# Patient Record
Sex: Male | Born: 1955 | Race: White | Hispanic: No | Marital: Married | Smoking: Current every day smoker
Health system: Southern US, Community
[De-identification: ages and names within clinical notes are randomized; demographics above are authoritative.]

## PROBLEM LIST (undated history)

## (undated) DIAGNOSIS — S2239XA Fracture of one rib, unspecified side, initial encounter for closed fracture: Secondary | ICD-10-CM

## (undated) DIAGNOSIS — I4891 Unspecified atrial fibrillation: Secondary | ICD-10-CM

## (undated) DIAGNOSIS — F1010 Alcohol abuse, uncomplicated: Secondary | ICD-10-CM

## (undated) DIAGNOSIS — G8929 Other chronic pain: Secondary | ICD-10-CM

## (undated) DIAGNOSIS — I10 Essential (primary) hypertension: Secondary | ICD-10-CM

## (undated) DIAGNOSIS — M549 Dorsalgia, unspecified: Secondary | ICD-10-CM

## (undated) DIAGNOSIS — J449 Chronic obstructive pulmonary disease, unspecified: Secondary | ICD-10-CM

## (undated) DIAGNOSIS — E7800 Pure hypercholesterolemia, unspecified: Secondary | ICD-10-CM

## (undated) DIAGNOSIS — M5430 Sciatica, unspecified side: Secondary | ICD-10-CM

## (undated) HISTORY — PX: HERNIA REPAIR: SHX51

---

## 2010-03-23 ENCOUNTER — Emergency Department (HOSPITAL_COMMUNITY): Admission: EM | Admit: 2010-03-23 | Discharge: 2010-03-23 | Payer: Self-pay | Admitting: Emergency Medicine

## 2010-07-26 ENCOUNTER — Emergency Department (HOSPITAL_COMMUNITY): Admission: EM | Admit: 2010-07-26 | Discharge: 2010-07-26 | Payer: Self-pay | Admitting: Emergency Medicine

## 2010-09-15 ENCOUNTER — Emergency Department (HOSPITAL_COMMUNITY)
Admission: EM | Admit: 2010-09-15 | Discharge: 2010-09-15 | Payer: Self-pay | Source: Home / Self Care | Admitting: Emergency Medicine

## 2010-12-11 LAB — COMPREHENSIVE METABOLIC PANEL
ALT: 57 U/L — ABNORMAL HIGH (ref 0–53)
Alkaline Phosphatase: 87 U/L (ref 39–117)
BUN: 16 mg/dL (ref 6–23)
CO2: 25 mEq/L (ref 19–32)
GFR calc non Af Amer: >60 mL/min (ref >60)
Glucose, Bld: 157 mg/dL — ABNORMAL HIGH (ref 70–99)
Potassium: 3 mEq/L — ABNORMAL LOW (ref 3.5–5.1)
Sodium: 139 mEq/L (ref 135–145)
Total Protein: 7.3 g/dL (ref 6.0–8.3)

## 2010-12-11 LAB — RAPID URINE DRUG SCREEN, HOSP PERFORMED
Barbiturates: NOT DETECTED
Benzodiazepines: POSITIVE — AB

## 2010-12-11 LAB — CBC
HCT: 45.2 % (ref 39.0–52.0)
Hemoglobin: 16.3 g/dL (ref 13.0–17.0)
MCHC: 36.1 g/dL — ABNORMAL HIGH (ref 30.0–36.0)
MCV: 92.2 fL (ref 78.0–100.0)
RDW: 12.8 % (ref 11.5–15.5)
WBC: 7.7 10*3/uL (ref 4.0–10.5)

## 2010-12-11 LAB — URINALYSIS, ROUTINE W REFLEX MICROSCOPIC
Leukocytes, UA: NEGATIVE
Nitrite: NEGATIVE
Protein, ur: NEGATIVE mg/dL
Specific Gravity, Urine: 1.015 (ref 1.005–1.030)
Urobilinogen, UA: 0.2 mg/dL (ref 0.0–1.0)

## 2010-12-11 LAB — DIFFERENTIAL
Basophils Absolute: 0 10*3/uL (ref 0.0–0.1)
Basophils Relative: 1 % (ref 0–1)
Eosinophils Absolute: 0.3 10*3/uL (ref 0.0–0.7)
Neutro Abs: 4 10*3/uL (ref 1.7–7.7)
Neutrophils Relative %: 52 % (ref 43–77)

## 2010-12-11 LAB — ETHANOL
Alcohol, Ethyl (B): 157 mg/dL — ABNORMAL HIGH (ref 0–10)
Alcohol, Ethyl (B): 259 mg/dL — ABNORMAL HIGH (ref 0–10)

## 2010-12-11 LAB — URINE MICROSCOPIC-ADD ON

## 2010-12-17 LAB — BASIC METABOLIC PANEL
CO2: 23 mEq/L (ref 19–32)
Chloride: 103 mEq/L (ref 96–112)
Creatinine, Ser: 1.69 mg/dL — ABNORMAL HIGH (ref 0.4–1.5)
GFR calc Af Amer: 52 mL/min — ABNORMAL LOW (ref >60)
Sodium: 135 mEq/L (ref 135–145)

## 2010-12-17 LAB — URINALYSIS, ROUTINE W REFLEX MICROSCOPIC
Glucose, UA: NEGATIVE mg/dL
Leukocytes, UA: NEGATIVE
Protein, ur: NEGATIVE mg/dL
Specific Gravity, Urine: 1.02 (ref 1.005–1.030)
Urobilinogen, UA: 0.2 mg/dL (ref 0.0–1.0)

## 2010-12-17 LAB — URINE MICROSCOPIC-ADD ON

## 2013-03-22 ENCOUNTER — Emergency Department (HOSPITAL_COMMUNITY): Payer: BC Managed Care – PPO

## 2013-03-22 ENCOUNTER — Encounter (HOSPITAL_COMMUNITY): Payer: Self-pay | Admitting: *Deleted

## 2013-03-22 ENCOUNTER — Emergency Department (HOSPITAL_COMMUNITY)
Admission: EM | Admit: 2013-03-22 | Discharge: 2013-03-22 | Disposition: A | Discharge status: Home / Self Care | Payer: BC Managed Care – PPO | Attending: Emergency Medicine | Admitting: Emergency Medicine

## 2013-03-22 DIAGNOSIS — M549 Dorsalgia, unspecified: Secondary | ICD-10-CM | POA: Insufficient documentation

## 2013-03-22 DIAGNOSIS — J40 Bronchitis, not specified as acute or chronic: Secondary | ICD-10-CM | POA: Insufficient documentation

## 2013-03-22 DIAGNOSIS — R059 Cough, unspecified: Secondary | ICD-10-CM | POA: Insufficient documentation

## 2013-03-22 DIAGNOSIS — I10 Essential (primary) hypertension: Secondary | ICD-10-CM | POA: Insufficient documentation

## 2013-03-22 DIAGNOSIS — S20219A Contusion of unspecified front wall of thorax, initial encounter: Secondary | ICD-10-CM | POA: Insufficient documentation

## 2013-03-22 DIAGNOSIS — Y92009 Unspecified place in unspecified non-institutional (private) residence as the place of occurrence of the external cause: Secondary | ICD-10-CM | POA: Insufficient documentation

## 2013-03-22 DIAGNOSIS — Z862 Personal history of diseases of the blood and blood-forming organs and certain disorders involving the immune mechanism: Secondary | ICD-10-CM | POA: Insufficient documentation

## 2013-03-22 DIAGNOSIS — W010XXA Fall on same level from slipping, tripping and stumbling without subsequent striking against object, initial encounter: Secondary | ICD-10-CM | POA: Insufficient documentation

## 2013-03-22 DIAGNOSIS — Z8639 Personal history of other endocrine, nutritional and metabolic disease: Secondary | ICD-10-CM | POA: Insufficient documentation

## 2013-03-22 DIAGNOSIS — R05 Cough: Secondary | ICD-10-CM | POA: Insufficient documentation

## 2013-03-22 DIAGNOSIS — S20212A Contusion of left front wall of thorax, initial encounter: Secondary | ICD-10-CM

## 2013-03-22 DIAGNOSIS — W1809XA Striking against other object with subsequent fall, initial encounter: Secondary | ICD-10-CM | POA: Insufficient documentation

## 2013-03-22 DIAGNOSIS — Y9389 Activity, other specified: Secondary | ICD-10-CM | POA: Insufficient documentation

## 2013-03-22 DIAGNOSIS — G8929 Other chronic pain: Secondary | ICD-10-CM | POA: Insufficient documentation

## 2013-03-22 MED ORDER — CYCLOBENZAPRINE HCL 10 MG PO TABS
10.0000 mg | ORAL_TABLET | Freq: Once | ORAL | Status: AC
  Administered 2013-03-22: 10 mg via ORAL
  Filled 2013-03-22: qty 1

## 2013-03-22 MED ORDER — AMOXICILLIN 500 MG PO CAPS: 500.0000 mg | ORAL_CAPSULE | Freq: Three times a day (TID) | ORAL | Status: DC

## 2013-03-22 MED ORDER — METHOCARBAMOL 500 MG PO TABS: ORAL_TABLET | ORAL | Status: DC

## 2013-03-22 MED ORDER — OXYCODONE-ACETAMINOPHEN 5-325 MG PO TABS: ORAL_TABLET | ORAL | Status: DC

## 2013-03-22 MED ORDER — OXYCODONE-ACETAMINOPHEN 5-325 MG PO TABS
1.0000 | ORAL_TABLET | Freq: Four times a day (QID) | ORAL | Status: DC | PRN
  Administered 2013-03-22: 1 via ORAL
  Filled 2013-03-22: qty 1

## 2013-03-22 NOTE — ED Notes (Signed)
Pt fell last night because he tripped over his dog, hitting his mid center to left side chest area against a concrete hearth. Pt c/o chest pain that is worse with palpation, respirations,

## 2013-03-22 NOTE — ED Provider Notes (Signed)
History    This chart was scribed for Ward Givens, MD by Leone Payor, ED Scribe. This patient was seen in room APA11/APA11 and the patient's care was started 1:49 PM.   CSN: [id number]  Arrival date & time 03/22/13  1304   First MD Initiated Contact with Patient 03/22/13 1338      Chief Complaint  Patient presents with  . Fall  . Chest Pain     The history is provided by the patient. No language interpreter was used.    HPI Comments: William Franklin is a 57 y.o. male who presents to the Emergency Department complaining of a fall that occurred last night. States he tripped over his dog while standing up to get off his couch, causing him to fall against a concrete hearth onto his left chest. He denies head injury or LOC. He is now complaining of pain to L chest and it hurts to cough. States he had a cough prior to the fall but now it painful. He denies any fevers, shortness of breath or fever.  He denies nausea, vomiting or abdominal pain.  He takes BP and cholesterol medication and percocet for back pain. His medications are prescribed by [facility]. He states he took his last percocet pill yesterday after he fell.   PCP Dr Burnadette Pop, [place], [facility]  Pt is a current everyday smoker but denies alcohol use.  History reviewed. No pertinent past medical history. HTN High cholesterol Chronic back pain  History reviewed. No pertinent past surgical history.  No family history on file.  History  Substance Use Topics  . Smoking status: Current Every Day Smoker  . Smokeless tobacco: Not on file  . Alcohol Use: No   Employed in [employer]   Review of Systems  Constitutional: Negative for fever.  Respiratory: Positive for cough.   Cardiovascular: Positive for chest pain.  All other systems reviewed and are negative.    Allergies  Review of patient's allergies indicates no known allergies.  Home Medications   Current Outpatient Rx  Name  Route  Sig  Dispense  Refill   . oxyCODONE-acetaminophen (PERCOCET) 10-325 MG per tablet   Oral   Take 1 tablet by mouth 3 (three) times daily as needed for pain.           BP 154/89  Pulse 84  Temp(Src) 98 F (36.7 C) (Oral)  Resp 19  Ht 6\' 2"  (1.88 m)  Wt 240 lb (108.863 kg)  BMI 30.8 kg/m2  SpO2 99%  Vital signs normal    Physical Exam  Nursing note and vitals reviewed. Constitutional: He is oriented to person, place, and time. He appears well-developed and well-nourished.  Non-toxic appearance. He does not appear ill. No distress.  HENT:  Head: Normocephalic and atraumatic.  Right Ear: External ear normal.  Left Ear: External ear normal.  Nose: Nose normal. No mucosal edema or rhinorrhea.  Mouth/Throat: Oropharynx is clear and moist and mucous membranes are normal. No dental abscesses or edematous.  Eyes: Conjunctivae and EOM are normal. Pupils are equal, round, and reactive to light.  Neck: Normal range of motion and full passive range of motion without pain. Neck supple.  Cardiovascular: Normal rate, regular rhythm and normal heart sounds.  Exam reveals no gallop and no friction rub.   No murmur heard. Pulmonary/Chest: Effort normal and breath sounds normal. No respiratory distress. He has no wheezes. He has no rhonchi. He exhibits no tenderness and no crepitus.  Tender in the anterior axillary line near to the axilla. No crepitance, no bruising or abrasion.  Initially had rales at the bases but cleared when he coughed.   Abdominal: Soft. Normal appearance and bowel sounds are normal. He exhibits no distension. There is no tenderness. There is no rebound and no guarding.  Musculoskeletal: Normal range of motion. He exhibits no edema and no tenderness.  Moves all extremities well.   Neurological: He is alert and oriented to person, place, and time. He has normal strength. No cranial nerve deficit.  Skin: Skin is warm, dry and intact. No rash noted. No erythema. No pallor.  Psychiatric: He has  a normal mood and affect. His speech is normal and behavior is normal. His mood appears not anxious.    ED Course  Procedures (including critical care time)  Medications  oxyCODONE-acetaminophen (PERCOCET/ROXICET) 5-325 MG per tablet 1 tablet (1 tablet Oral Given 03/22/13 1403)  cyclobenzaprine (FLEXERIL) tablet 10 mg (10 mg Oral Given 03/22/13 1404)     DIAGNOSTIC STUDIES: Oxygen Saturation is 99% on RA, normal by my interpretation.    COORDINATION OF CARE: 1:54 PM Discussed treatment plan with pt at bedside and pt agreed to plan.   Pt states he normally gets his percocet from the the [facility], he took his last pill yesterday. Pt needs work note for 2 days.   Dg Chest 2 View  03/22/2013   *RADIOLOGY REPORT*  Clinical Data: Fall and chest pain  CHEST - 2 VIEW  Comparison: None  Findings:  Normal heart size.  There is no pleural effusion or edema identified.  No airspace consolidation.  Coarsened interstitial markings are noted bilaterally. Multiple age indeterminant right rib fractures are noted.  This involves the posteriora right eighth and ninth ribs and anterior second rib.  No displaced left rib fractures identified.  IMPRESSION:  1.  Chronic interstitial coarsening suggestive of COPD. 2.  Multiple age indeterminate right rib fractures.   Original Report Authenticated By: Signa Kell, M.D.    Dg Ribs Unilateral Left  03/22/2013   *RADIOLOGY REPORT*  Clinical Data: Left rib pain  LEFT RIBS - 2 VIEW  Comparison: Plain film 03/20/2013  Findings: Dedicated views of the left ribs demonstrate w healed rib fractures of the left lateral seventh and eighth ribs.  There is atelectasis at the left lung base.  No pneumothorax.  IMPRESSION: Remote fractures of the left ribs.  Left basilar atelectasis.  No pneumothorax.   Original Report Authenticated By: Genevive Bi, M.D.     1. Contusion, chest wall, left, initial encounter   2. Fall at home, initial encounter   3. Bronchitis       New Prescriptions   AMOXICILLIN (AMOXIL) 500 MG CAPSULE    Take 1 capsule (500 mg total) by mouth 3 (three) times daily.   METHOCARBAMOL (ROBAXIN) 500 MG TABLET    Take 2 po QID for chest wall soreness   OXYCODONE-ACETAMINOPHEN (PERCOCET/ROXICET) 5-325 MG PER TABLET    Take 1 or 2 po Q 6hrs for pain     Plan discharge   Devoria Albe, MD, FACEP      MDM   I personally performed the services described in this documentation, which was scribed in my presence. The recorded information has been reviewed and considered.  Devoria Albe, MD, FACEP   Ward Givens, MD 03/22/13 1500

## 2013-03-24 ENCOUNTER — Encounter (HOSPITAL_COMMUNITY): Payer: Self-pay | Admitting: *Deleted

## 2013-03-24 ENCOUNTER — Emergency Department (HOSPITAL_COMMUNITY)
Admission: EM | Admit: 2013-03-24 | Discharge: 2013-03-24 | Disposition: A | Discharge status: Home / Self Care | Payer: BC Managed Care – PPO | Attending: Emergency Medicine | Admitting: Emergency Medicine

## 2013-03-24 DIAGNOSIS — S2239XA Fracture of one rib, unspecified side, initial encounter for closed fracture: Secondary | ICD-10-CM

## 2013-03-24 DIAGNOSIS — F17200 Nicotine dependence, unspecified, uncomplicated: Secondary | ICD-10-CM | POA: Insufficient documentation

## 2013-03-24 DIAGNOSIS — Y929 Unspecified place or not applicable: Secondary | ICD-10-CM | POA: Insufficient documentation

## 2013-03-24 DIAGNOSIS — Z8639 Personal history of other endocrine, nutritional and metabolic disease: Secondary | ICD-10-CM | POA: Insufficient documentation

## 2013-03-24 DIAGNOSIS — S20212D Contusion of left front wall of thorax, subsequent encounter: Secondary | ICD-10-CM

## 2013-03-24 DIAGNOSIS — J4489 Other specified chronic obstructive pulmonary disease: Secondary | ICD-10-CM | POA: Insufficient documentation

## 2013-03-24 DIAGNOSIS — Z79899 Other long term (current) drug therapy: Secondary | ICD-10-CM | POA: Insufficient documentation

## 2013-03-24 DIAGNOSIS — W1809XA Striking against other object with subsequent fall, initial encounter: Secondary | ICD-10-CM | POA: Insufficient documentation

## 2013-03-24 DIAGNOSIS — Z862 Personal history of diseases of the blood and blood-forming organs and certain disorders involving the immune mechanism: Secondary | ICD-10-CM | POA: Insufficient documentation

## 2013-03-24 DIAGNOSIS — Y9389 Activity, other specified: Secondary | ICD-10-CM | POA: Insufficient documentation

## 2013-03-24 DIAGNOSIS — I10 Essential (primary) hypertension: Secondary | ICD-10-CM | POA: Insufficient documentation

## 2013-03-24 DIAGNOSIS — S20219A Contusion of unspecified front wall of thorax, initial encounter: Secondary | ICD-10-CM | POA: Insufficient documentation

## 2013-03-24 DIAGNOSIS — J449 Chronic obstructive pulmonary disease, unspecified: Secondary | ICD-10-CM | POA: Insufficient documentation

## 2013-03-24 HISTORY — DX: Chronic obstructive pulmonary disease, unspecified: J44.9

## 2013-03-24 HISTORY — DX: Pure hypercholesterolemia, unspecified: E78.00

## 2013-03-24 HISTORY — DX: Fracture of one rib, unspecified side, initial encounter for closed fracture: S22.39XA

## 2013-03-24 HISTORY — DX: Essential (primary) hypertension: I10

## 2013-03-24 MED ORDER — OXYCODONE-ACETAMINOPHEN 5-325 MG PO TABS: 1.0000 | ORAL_TABLET | Freq: Four times a day (QID) | ORAL | Status: DC | PRN

## 2013-03-24 NOTE — ED Provider Notes (Signed)
History  This chart was scribed for [facility]. Danae Orleans, working with Ward Givens, MD by Ardelia Mems, ED Scribe. This patient was seen in room APFT24/APFT24 and the patient's care was started at 3:25 PM.   CSN: [id number]  Arrival date & time 03/24/13  1338    Chief Complaint  Patient presents with  . Pain    The history is provided by the patient. No language interpreter was used.   HPI Comments: William Franklin is a 57 y.o. male who presents to the Emergency Department complaining of persistent chest wall pain onset after an accidental fall that occurred 2 days ago. Pt states that he was avoiding his dog and fell onto a hard surface, hitting his chest. Pt states that he suffered a contusion to his chest wall and possibly cracked ribs after the fall 2 days ago. Pt was seen in ED 03/22/13 for this fall. Patient states that he has taken all of pain medication he was given and he is due to go back to work tomorrow but the area continues to hurt. Pt states that he has COPD and chronic cough which has been making the area even more sore. Pt denies fever, sore throat, rash, back pain, dysuria or any other symptoms. Pt is a current every day smoker.   PCP- None  Past Medical History  Diagnosis Date  . COPD (chronic obstructive pulmonary disease)   . Hypertension   . Hypercholesteremia    Past Surgical History  Procedure Laterality Date  . Hernia repair     No family history on file. History  Substance Use Topics  . Smoking status: Current Every Day Smoker  . Smokeless tobacco: Not on file  . Alcohol Use: No    Review of Systems  Constitutional: Negative for fever and chills.  HENT: Negative for congestion, sore throat and rhinorrhea.   Respiratory: Positive for cough. Negative for shortness of breath.   Cardiovascular: Negative for chest pain and leg swelling.  Gastrointestinal: Negative for nausea, vomiting, abdominal pain and diarrhea.  Genitourinary: Negative for dysuria  and hematuria.  Musculoskeletal: Negative for back pain.       Chest wall contusion and possible cracked ribs.  Skin: Negative for rash.  Neurological: Negative for headaches.  Psychiatric/Behavioral: Negative for confusion.   A complete 10 system review of systems was obtained and all systems are negative except as noted in the HPI and PMH.   Allergies  Review of patient's allergies indicates no known allergies.  Home Medications   Current Outpatient Rx  Name  Route  Sig  Dispense  Refill  . amoxicillin (AMOXIL) 500 MG capsule   Oral   Take 1 capsule (500 mg total) by mouth 3 (three) times daily.   21 capsule   0   . methocarbamol (ROBAXIN) 500 MG tablet      Take 2 po QID for chest wall soreness   60 tablet   0   . oxyCODONE-acetaminophen (PERCOCET) 10-325 MG per tablet   Oral   Take 1 tablet by mouth 3 (three) times daily as needed for pain.         Marland Kitchen oxyCODONE-acetaminophen (PERCOCET/ROXICET) 5-325 MG per tablet      Take 1 or 2 po Q 6hrs for pain   15 tablet   0    Triage Vitals: BP 155/91  Pulse 95  Temp(Src) 98.3 F (36.8 C) (Oral)  Resp 22  Ht 6\' 2"  (1.88 m)  Wt 240 lb (413.244  kg)  BMI 30.8 kg/m2  SpO2 97%  Physical Exam  Nursing note and vitals reviewed. Constitutional: He is oriented to person, place, and time. He appears well-developed and well-nourished.  HENT:  Head: Normocephalic and atraumatic.  Speaks in complete sentences.  Eyes: Conjunctivae and EOM are normal. Pupils are equal, round, and reactive to light.  Neck: Normal range of motion. No tracheal deviation present.  Cardiovascular: Normal rate, regular rhythm and normal heart sounds.   Pulmonary/Chest: Effort normal and breath sounds normal. No respiratory distress.  On lung examinations, course breath sounds but no congestion. Left chest wall soreness, no palpable deformity.  Abdominal: Soft. Bowel sounds are normal. There is no tenderness.  Musculoskeletal:  Capillary refill  less than 3 seconds in all extremities.  Neurological: He is alert and oriented to person, place, and time.  Skin: Skin is warm and dry. No rash noted.  Psychiatric: He has a normal mood and affect.    ED Course  Procedures (including critical care time)  DIAGNOSTIC STUDIES: Oxygen Saturation is 97% on RA, normal by my interpretation.    COORDINATION OF CARE: 3:37 PM- Pt advised of plan to receive Percocet upon discharge and pt agrees..      Labs Reviewed - No data to display  No results found.  No diagnosis found.  MDM  I have reviewed nursing notes, vital signs, and all appropriate lab and imaging results for this patient. Patient sustained a fall 2 days ago and injured the chest wall particularly on the left. The patient states that he is out of pain medication and [facility] Patient also states that it takes at least 10 days for him to get a prescription by mail.  Prescription for Percocet one every 6 hours #15 given to the patient. Patient advised that with her pain management [facility]. Patient educated on recent to return items.           Kathie Dike, PA-C 03/24/13 1642

## 2013-03-24 NOTE — ED Notes (Signed)
Seen here Sunday after a fall. Dx with contusion of the chest. Patient  States he has taken all of pain med and is due to go back to work tomorrow but area continues to hurt. States he has a chronic cough which has been making the area even more sore.

## 2013-03-27 NOTE — ED Provider Notes (Signed)
Medical screening examination/treatment/procedure(s) were performed by non-physician practitioner and as supervising physician I was immediately available for consultation/collaboration.  Shelda Jakes, MD 03/27/13 [phone]

## 2013-04-04 ENCOUNTER — Encounter (HOSPITAL_COMMUNITY): Payer: Self-pay | Admitting: *Deleted

## 2013-04-04 ENCOUNTER — Emergency Department (HOSPITAL_COMMUNITY)
Admission: EM | Admit: 2013-04-04 | Discharge: 2013-04-04 | Disposition: A | Discharge status: Home / Self Care | Payer: BC Managed Care – PPO | Attending: Emergency Medicine | Admitting: Emergency Medicine

## 2013-04-04 ENCOUNTER — Emergency Department (HOSPITAL_COMMUNITY): Payer: BC Managed Care – PPO

## 2013-04-04 DIAGNOSIS — E7800 Pure hypercholesterolemia, unspecified: Secondary | ICD-10-CM | POA: Insufficient documentation

## 2013-04-04 DIAGNOSIS — Z79899 Other long term (current) drug therapy: Secondary | ICD-10-CM | POA: Insufficient documentation

## 2013-04-04 DIAGNOSIS — Y9389 Activity, other specified: Secondary | ICD-10-CM | POA: Insufficient documentation

## 2013-04-04 DIAGNOSIS — J449 Chronic obstructive pulmonary disease, unspecified: Secondary | ICD-10-CM | POA: Insufficient documentation

## 2013-04-04 DIAGNOSIS — W268XXA Contact with other sharp object(s), not elsewhere classified, initial encounter: Secondary | ICD-10-CM | POA: Insufficient documentation

## 2013-04-04 DIAGNOSIS — J4489 Other specified chronic obstructive pulmonary disease: Secondary | ICD-10-CM | POA: Insufficient documentation

## 2013-04-04 DIAGNOSIS — F17200 Nicotine dependence, unspecified, uncomplicated: Secondary | ICD-10-CM | POA: Insufficient documentation

## 2013-04-04 DIAGNOSIS — I10 Essential (primary) hypertension: Secondary | ICD-10-CM | POA: Insufficient documentation

## 2013-04-04 DIAGNOSIS — M795 Residual foreign body in soft tissue: Secondary | ICD-10-CM

## 2013-04-04 DIAGNOSIS — Z23 Encounter for immunization: Secondary | ICD-10-CM | POA: Insufficient documentation

## 2013-04-04 DIAGNOSIS — Z8781 Personal history of (healed) traumatic fracture: Secondary | ICD-10-CM | POA: Insufficient documentation

## 2013-04-04 DIAGNOSIS — IMO0002 Reserved for concepts with insufficient information to code with codable children: Secondary | ICD-10-CM | POA: Insufficient documentation

## 2013-04-04 DIAGNOSIS — Y9289 Other specified places as the place of occurrence of the external cause: Secondary | ICD-10-CM | POA: Insufficient documentation

## 2013-04-04 HISTORY — DX: Fracture of one rib, unspecified side, initial encounter for closed fracture: S22.39XA

## 2013-04-04 MED ORDER — CEPHALEXIN 500 MG PO CAPS: 500.0000 mg | ORAL_CAPSULE | Freq: Four times a day (QID) | ORAL | Status: DC

## 2013-04-04 MED ORDER — CEPHALEXIN 500 MG PO CAPS
500.0000 mg | ORAL_CAPSULE | Freq: Once | ORAL | Status: AC
  Administered 2013-04-04: 500 mg via ORAL
  Filled 2013-04-04: qty 1

## 2013-04-04 MED ORDER — TETANUS-DIPHTH-ACELL PERTUSSIS 5-2.5-18.5 LF-MCG/0.5 IM SUSP
0.5000 mL | Freq: Once | INTRAMUSCULAR | Status: AC
  Administered 2013-04-04: 0.5 mL via INTRAMUSCULAR
  Filled 2013-04-04: qty 0.5

## 2013-04-04 MED ORDER — LIDOCAINE HCL (PF) 2 % IJ SOLN
10.0000 mL | Freq: Once | INTRAMUSCULAR | Status: AC
  Administered 2013-04-04: 10 mL via INTRADERMAL

## 2013-04-04 MED ORDER — HYDROCODONE-ACETAMINOPHEN 5-325 MG PO TABS: ORAL_TABLET | ORAL | Status: DC

## 2013-04-04 MED ORDER — LIDOCAINE HCL (PF) 2 % IJ SOLN
INTRAMUSCULAR | Status: AC
  Filled 2013-04-04: qty 10

## 2013-04-04 NOTE — ED Notes (Signed)
Pt states he was working in the garden today and and a piece of wood may have gotten stuck in his arm. Abrasion to left lower arm with mild swelling around the area. NAD

## 2013-04-04 NOTE — ED Provider Notes (Signed)
History    CSN: [id number] Arrival date & time 04/04/13  1644  First MD Initiated Contact with Patient 04/04/13 1719     Chief Complaint  Patient presents with  . Arm Injury   (Consider location/radiation/quality/duration/timing/severity/associated sxs/prior Treatment) HPI Comments: Corinthian Mizrahi is a 57 y.o. male who presents to the Emergency Department complaining of possible foreign body to the left forearm.  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  He believes a piece of the wood may still be lodged under the skin.  He c/o redness and pain to the area.  Nothing makes the pain better or worse.  He denies numbness to the extremity or swelling ot his hands or fingers.  Last Td is unknown  The history is provided by the patient.   Past Medical History  Diagnosis Date  . COPD (chronic obstructive pulmonary disease)   . Hypertension   . Hypercholesteremia   . Rib fracture 03/24/2013   Past Surgical History  Procedure Laterality Date  . Hernia repair     No family history on file. History  Substance Use Topics  . Smoking status: Current Every Day Smoker  . Smokeless tobacco: Not on file  . Alcohol Use: No    Review of Systems  Constitutional: Negative for fever and chills.  Musculoskeletal: Negative for back pain, joint swelling and arthralgias.  Skin: Positive for wound.       Laceration   Neurological: Negative for dizziness, weakness and numbness.  Hematological: Does not bruise/bleed easily.  All other systems reviewed and are negative.    Allergies  Review of patient's allergies indicates no known allergies.  Home Medications   Current Outpatient Rx  Name  Route  Sig  Dispense  Refill  . atorvastatin (LIPITOR) 80 MG tablet   Oral   Take 40 mg by mouth at bedtime.         Marland Kitchen diltiazem (CARDIZEM) 120 MG tablet   Oral   Take 120 mg by mouth daily.         . flunisolide (NASAREL) 29  MCG/ACT (0.025%) nasal spray   Nasal   Place 2 sprays into the nose daily.          . hydrochlorothiazide (HYDRODIURIL) 25 MG tablet   Oral   Take 25 mg by mouth daily.         Marland Kitchen leflunomide (ARAVA) 10 MG tablet   Oral   Take 5 mg by mouth daily.          Marland Kitchen lisinopril (PRINIVIL,ZESTRIL) 40 MG tablet      40 mg. Take 40 mg by mouth daily.         . Multiple Vitamin (MULTIVITAMIN WITH MINERALS) TABS   Oral   Take 1 tablet by mouth daily.         . temazepam (RESTORIL) 30 MG capsule      30 mg. Take 30 mg by mouth at bedtime as needed for Sleep.         Marland Kitchen tacrolimus (PROTOPIC) 0.03 % ointment   Topical   Apply 1 application topically 2 (two) times daily.          BP 115/100  Pulse 107  Temp(Src) 97.3 F (36.3 C) (Oral)  Resp 16  Ht 6\' 2"  (1.88 m)  Wt 240 lb (108.863 kg)  BMI 30.8 kg/m2  SpO2 98% Physical Exam  Nursing note and  vitals reviewed. Constitutional: He is oriented to person, place, and time. He appears well-developed and well-nourished. No distress.  HENT:  Head: Normocephalic and atraumatic.  Cardiovascular: Normal rate, regular rhythm and normal heart sounds.   Pulmonary/Chest: Effort normal and breath sounds normal.  Musculoskeletal: He exhibits tenderness. He exhibits no edema.  Neurological: He is alert and oriented to person, place, and time. He exhibits normal muscle tone. Coordination normal.  Skin: Skin is warm. Laceration noted.  Abrasion and small puncture type wound to the left forearm.  No bleeding, distal sensation intact.  Pt has full ROM of the left elbow, wrist and fingers.  Radial pulse brisk, CR< 2 sec    ED Course  FOREIGN BODY REMOVAL Date/Time: 04/04/2013 6:03 PM Performed by: Trisha Mangle, Ashleymarie Granderson L. Authorized by: Maxwell Caul Consent: Verbal consent obtained. Risks and benefits: risks, benefits and alternatives were discussed Consent given by: patient Patient understanding: patient states understanding of the  procedure being performed Patient consent: the patient's understanding of the procedure matches consent given Imaging studies: imaging studies available Patient identity confirmed: verbally with patient and arm band Time out: Immediately prior to procedure a "time out" was called to verify the correct patient, procedure, equipment, support staff and site/side marked as required. Body area: skin General location: upper extremity Location details: left forearm Anesthesia: local infiltration Local anesthetic: lidocaine 2% without epinephrine Anesthetic total: 3 ml Patient sedated: no Patient restrained: no Patient cooperative: yes Localization method: visualized and probed Removal mechanism: hemostat and irrigation Dressing: dressing applied Tendon involvement: none Depth: subcutaneous Complexity: simple 2 objects recovered. Objects recovered: wood fragments Post-procedure assessment: foreign body removed Patient tolerance: Patient tolerated the procedure well with no immediate complications.   (including critical care time) Labs Reviewed - No data to display Dg Forearm Left  04/04/2013   *RADIOLOGY REPORT*  Clinical Data: Laceration of forearm with wooden stake.  LEFT FOREARM - 2 VIEW  Comparison: None.  Findings: A ball point pen was used to mark the laceration site.  No fracture or radiographically apparent retained foreign body. Spurring is present at the first carpal metacarpal articulation. There is mild spurring of the distal radioulnar joint.  IMPRESSION:  1.  Laceration along the volar distal forearm, without radiographically apparent retained foreign body or underlying bony abnormality.   Original Report Authenticated By: Gaylyn Rong, M.D.     MDM    TDaP given.  Wooden FB removed.  Wound was irrigated throughly and examined w/o evidence of remaining FB's.  Will start Keflex, vicodin, pt agrees to return here if any signs of infection.  Remains NV intact.    Pt feels  improved after observation and/or treatment in ED.   Deeanne Deininger L. Trisha Mangle, PA-C 04/07/13 1123

## 2013-04-04 NOTE — ED Notes (Signed)
Incorrect note on inj.

## 2013-04-08 NOTE — ED Provider Notes (Signed)
Medical screening examination/treatment/procedure(s) were performed by non-physician practitioner and as supervising physician I was immediately available for consultation/collaboration.   Shelda Jakes, MD 04/08/13 [phone]

## 2013-07-14 ENCOUNTER — Emergency Department (HOSPITAL_COMMUNITY)
Admission: EM | Admit: 2013-07-14 | Discharge: 2013-07-14 | Discharge status: Against Medical Advice | Payer: BC Managed Care – PPO

## 2016-05-28 ENCOUNTER — Emergency Department (HOSPITAL_COMMUNITY)
Admission: EM | Admit: 2016-05-28 | Discharge: 2016-05-28 | Disposition: A | Discharge status: Home / Self Care | Payer: Self-pay | Attending: Emergency Medicine | Admitting: Emergency Medicine

## 2016-05-28 ENCOUNTER — Encounter (HOSPITAL_COMMUNITY): Payer: Self-pay | Admitting: *Deleted

## 2016-05-28 DIAGNOSIS — Y9389 Activity, other specified: Secondary | ICD-10-CM | POA: Insufficient documentation

## 2016-05-28 DIAGNOSIS — R002 Palpitations: Secondary | ICD-10-CM | POA: Insufficient documentation

## 2016-05-28 DIAGNOSIS — I10 Essential (primary) hypertension: Secondary | ICD-10-CM | POA: Insufficient documentation

## 2016-05-28 DIAGNOSIS — S46912A Strain of unspecified muscle, fascia and tendon at shoulder and upper arm level, left arm, initial encounter: Secondary | ICD-10-CM | POA: Insufficient documentation

## 2016-05-28 DIAGNOSIS — J449 Chronic obstructive pulmonary disease, unspecified: Secondary | ICD-10-CM | POA: Insufficient documentation

## 2016-05-28 DIAGNOSIS — Y929 Unspecified place or not applicable: Secondary | ICD-10-CM | POA: Insufficient documentation

## 2016-05-28 DIAGNOSIS — Y999 Unspecified external cause status: Secondary | ICD-10-CM | POA: Insufficient documentation

## 2016-05-28 DIAGNOSIS — X501XXA Overexertion from prolonged static or awkward postures, initial encounter: Secondary | ICD-10-CM | POA: Insufficient documentation

## 2016-05-28 DIAGNOSIS — Z79899 Other long term (current) drug therapy: Secondary | ICD-10-CM | POA: Insufficient documentation

## 2016-05-28 DIAGNOSIS — F17200 Nicotine dependence, unspecified, uncomplicated: Secondary | ICD-10-CM | POA: Insufficient documentation

## 2016-05-28 HISTORY — DX: Unspecified atrial fibrillation: I48.91

## 2016-05-28 MED ORDER — DICLOFENAC SODIUM 75 MG PO TBEC: 75.0000 mg | DELAYED_RELEASE_TABLET | Freq: Two times a day (BID) | ORAL | 0 refills | Status: DC

## 2016-05-28 MED ORDER — CYCLOBENZAPRINE HCL 10 MG PO TABS
10.0000 mg | ORAL_TABLET | Freq: Once | ORAL | Status: AC
  Administered 2016-05-28: 10 mg via ORAL
  Filled 2016-05-28: qty 1

## 2016-05-28 MED ORDER — KETOROLAC TROMETHAMINE 10 MG PO TABS
10.0000 mg | ORAL_TABLET | Freq: Once | ORAL | Status: AC
  Administered 2016-05-28: 10 mg via ORAL
  Filled 2016-05-28: qty 1

## 2016-05-28 MED ORDER — TRAMADOL HCL 50 MG PO TABS
100.0000 mg | ORAL_TABLET | Freq: Once | ORAL | Status: AC
Start: 2016-05-28 — End: 2016-05-28
  Administered 2016-05-28: 100 mg via ORAL
  Filled 2016-05-28: qty 2

## 2016-05-28 MED ORDER — DEXAMETHASONE 4 MG PO TABS: 4.0000 mg | ORAL_TABLET | Freq: Two times a day (BID) | ORAL | 0 refills | Status: DC

## 2016-05-28 MED ORDER — DEXAMETHASONE SODIUM PHOSPHATE 4 MG/ML IJ SOLN
8.0000 mg | Freq: Once | INTRAMUSCULAR | Status: AC
  Administered 2016-05-28: 8 mg via INTRAMUSCULAR
  Filled 2016-05-28: qty 2

## 2016-05-28 MED ORDER — TRAMADOL HCL 50 MG PO TABS: 50.0000 mg | ORAL_TABLET | Freq: Four times a day (QID) | ORAL | 0 refills | Status: DC | PRN

## 2016-05-28 MED ORDER — CYCLOBENZAPRINE HCL 10 MG PO TABS: 10.0000 mg | ORAL_TABLET | Freq: Three times a day (TID) | ORAL | 0 refills | Status: DC

## 2016-05-28 NOTE — ED Triage Notes (Signed)
Pt state that he was cutting a tree down this past Thursday, felt a "pop" in left shoulder, c/o pain to left shoulder,

## 2016-05-28 NOTE — ED Notes (Signed)
Patient verbalizes understanding of discharge instructions, prescriptions, home care and follow up care. Patient out of department at this time. 

## 2016-05-28 NOTE — Discharge Instructions (Signed)
[facility] concerning [facility]. Use Decadron and diclofenac 2 times daily with food. Use Flexeril 3 times daily for spasm pain in your shoulder. Use Ultram every 6 hours as needed for more severe pain. Ultram and Flexeril may cause drowsiness, please do not drive or operate machinery or drink alcohol while taking this medication. Please use your sling until seen by the physicians at [facility].

## 2016-05-28 NOTE — ED Provider Notes (Signed)
[facility]-EMERGENCY DEPT Provider Note   CSN: [id number] Arrival date & time: 05/28/16  0009     History   Chief Complaint Chief Complaint  Patient presents with  . Shoulder Pain    HPI William Franklin is a 60 y.o. male.  Patient is a 60 year old male who presents to the emergency department with a complaint of injury to his left shoulder.  The patient states that August 24 he was cutting down a tree. He felt a pop in his left shoulder, and has had pain in the shoulder since that time. The patient states he had rotator cuff problems in his right shoulder, and this feels very similar. He denies any cold extremity. He denies dropping objects, but states he cannot raise his arm beyond a certain point and he cannot lift a weight with his arm beyond a certain point. He is not taken anything for this discomfort.      Past Medical History:  Diagnosis Date  . Atrial fibrillation (HCC)   . COPD (chronic obstructive pulmonary disease) (HCC)   . Hypercholesteremia   . Hypertension   . Rib fracture 03/24/2013    There are no active problems to display for this patient.   Past Surgical History:  Procedure Laterality Date  . HERNIA REPAIR         Home Medications    Prior to Admission medications   Medication Sig Start Date End Date Taking? Authorizing Provider  atorvastatin (LIPITOR) 80 MG tablet Take 40 mg by mouth at bedtime.   Yes Historical Provider, MD  diltiazem (CARDIZEM) 120 MG tablet Take 120 mg by mouth daily.   Yes Historical Provider, MD  flunisolide (NASAREL) 29 MCG/ACT (0.025%) nasal spray Place 2 sprays into the nose daily.    Yes Historical Provider, MD  lisinopril (PRINIVIL,ZESTRIL) 40 MG tablet 40 mg. Take 40 mg by mouth daily.   Yes Historical Provider, MD  tacrolimus (PROTOPIC) 0.03 % ointment Apply 1 application topically 2 (two) times daily.   Yes Historical Provider, MD  temazepam (RESTORIL) 30 MG capsule 30 mg. Take 30 mg by mouth at bedtime as needed for  Sleep.   Yes Historical Provider, MD  cephALEXin (KEFLEX) 500 MG capsule Take 1 capsule (500 mg total) by mouth 4 (four) times daily. For 7 days 04/04/13   Tammy Triplett, PA-C  hydrochlorothiazide (HYDRODIURIL) 25 MG tablet Take 25 mg by mouth daily.    Historical Provider, MD  HYDROcodone-acetaminophen (NORCO/VICODIN) 5-325 MG per tablet Take one-two tabs po q 4-6 hrs prn pain 04/04/13   Tammy Triplett, PA-C  leflunomide (ARAVA) 10 MG tablet Take 5 mg by mouth daily.  03/05/13 03/05/14  Historical Provider, MD  Multiple Vitamin (MULTIVITAMIN WITH MINERALS) TABS Take 1 tablet by mouth daily.    Historical Provider, MD    Family History No family history on file.  Social History Social History  Substance Use Topics  . Smoking status: Current Every Day Smoker  . Smokeless tobacco: Never Used  . Alcohol use No     Allergies   Review of patient's allergies indicates no known allergies.   Review of Systems Review of Systems  Cardiovascular: Positive for palpitations.  Musculoskeletal: Positive for arthralgias.  All other systems reviewed and are negative.    Physical Exam Updated Vital Signs BP 130/87   Pulse 85   Temp 97.6 F (36.4 C) (Oral)   Resp 20   SpO2 96%   Physical Exam  Constitutional: He is oriented to person, place, and  time. He appears well-developed and well-nourished.  Non-toxic appearance.  HENT:  Head: Normocephalic.  Right Ear: Tympanic membrane and external ear normal.  Left Ear: Tympanic membrane and external ear normal.  Eyes: EOM and lids are normal. Pupils are equal, round, and reactive to light.  Neck: Normal range of motion. Neck supple. Carotid bruit is not present.  Cardiovascular: Normal rate, regular rhythm, normal heart sounds, intact distal pulses and normal pulses.   Pulmonary/Chest: Breath sounds normal. No respiratory distress.  Abdominal: Soft. Bowel sounds are normal. There is no tenderness. There is no guarding.  Musculoskeletal: Normal  range of motion.       Left shoulder: He exhibits tenderness, crepitus and pain. He exhibits no bony tenderness, no swelling, no effusion and no deformity.  No deformity of the left or right shoulder.  Lymphadenopathy:       Head (right side): No submandibular adenopathy present.       Head (left side): No submandibular adenopathy present.    He has no cervical adenopathy.  Neurological: He is alert and oriented to person, place, and time. He has normal strength. No cranial nerve deficit or sensory deficit.  Skin: Skin is warm and dry.  Psychiatric: He has a normal mood and affect. His speech is normal.  Nursing note and vitals reviewed.    ED Treatments / Results  Labs (all labs ordered are listed, but only abnormal results are displayed) Labs Reviewed - No data to display  EKG  EKG Interpretation None       Radiology No results found.  Procedures Procedures (including critical care time)  Medications Ordered in ED Medications - No data to display   Initial Impression / Assessment and Plan / ED Course  I have reviewed the triage vital signs and the nursing notes.  Pertinent labs & imaging results that were available during my care of the patient were reviewed by me and considered in my medical decision making (see chart for details).  Clinical Course    *I have reviewed nursing notes, vital signs, and all appropriate lab and imaging results for this patient.**  Final Clinical Impressions(s) / ED Diagnoses The examination favors left shoulder strain. The patient is fitted with a shoulder sling.  Prescription for Flexeril, Decadron, and Ultram given to the patient. Patient is referred to orthopedic surgery.    Final diagnoses:  Left shoulder strain, initial encounter    New Prescriptions New Prescriptions   No medications on file     Ivery Quale, PA-C 05/29/16 2244    Devoria Albe, MD 06/09/16 [phone]

## 2017-01-27 ENCOUNTER — Encounter (HOSPITAL_COMMUNITY): Payer: Self-pay | Admitting: *Deleted

## 2017-01-27 ENCOUNTER — Emergency Department (HOSPITAL_COMMUNITY): Payer: BLUE CROSS/BLUE SHIELD

## 2017-01-27 ENCOUNTER — Emergency Department (HOSPITAL_COMMUNITY)
Admission: EM | Admit: 2017-01-27 | Discharge: 2017-01-27 | Disposition: A | Discharge status: Home / Self Care | Payer: BLUE CROSS/BLUE SHIELD | Attending: Emergency Medicine | Admitting: Emergency Medicine

## 2017-01-27 DIAGNOSIS — Y9389 Activity, other specified: Secondary | ICD-10-CM | POA: Insufficient documentation

## 2017-01-27 DIAGNOSIS — S90415A Abrasion, left lesser toe(s), initial encounter: Secondary | ICD-10-CM | POA: Diagnosis not present

## 2017-01-27 DIAGNOSIS — I10 Essential (primary) hypertension: Secondary | ICD-10-CM | POA: Insufficient documentation

## 2017-01-27 DIAGNOSIS — F10120 Alcohol abuse with intoxication, uncomplicated: Secondary | ICD-10-CM | POA: Insufficient documentation

## 2017-01-27 DIAGNOSIS — Z79899 Other long term (current) drug therapy: Secondary | ICD-10-CM | POA: Insufficient documentation

## 2017-01-27 DIAGNOSIS — R4182 Altered mental status, unspecified: Secondary | ICD-10-CM | POA: Insufficient documentation

## 2017-01-27 DIAGNOSIS — R109 Unspecified abdominal pain: Secondary | ICD-10-CM | POA: Diagnosis not present

## 2017-01-27 DIAGNOSIS — J9811 Atelectasis: Secondary | ICD-10-CM | POA: Insufficient documentation

## 2017-01-27 DIAGNOSIS — Y92410 Unspecified street and highway as the place of occurrence of the external cause: Secondary | ICD-10-CM | POA: Insufficient documentation

## 2017-01-27 DIAGNOSIS — J449 Chronic obstructive pulmonary disease, unspecified: Secondary | ICD-10-CM | POA: Insufficient documentation

## 2017-01-27 DIAGNOSIS — F10920 Alcohol use, unspecified with intoxication, uncomplicated: Secondary | ICD-10-CM

## 2017-01-27 DIAGNOSIS — Y999 Unspecified external cause status: Secondary | ICD-10-CM | POA: Insufficient documentation

## 2017-01-27 DIAGNOSIS — S99922A Unspecified injury of left foot, initial encounter: Secondary | ICD-10-CM | POA: Diagnosis present

## 2017-01-27 DIAGNOSIS — F17200 Nicotine dependence, unspecified, uncomplicated: Secondary | ICD-10-CM | POA: Insufficient documentation

## 2017-01-27 LAB — CBC WITH DIFFERENTIAL/PLATELET
BASOS ABS: 0 10*3/uL (ref 0.0–0.1)
BASOS PCT: 0 %
Eosinophils Absolute: 0.4 10*3/uL (ref 0.0–0.7)
Eosinophils Relative: 3 %
HEMATOCRIT: 49.8 % (ref 39.0–52.0)
HEMOGLOBIN: 16.9 g/dL (ref 13.0–17.0)
Lymphocytes Relative: 24 %
Lymphs Abs: 2.7 10*3/uL (ref 0.7–4.0)
MCH: 32.8 pg (ref 26.0–34.0)
MCHC: 33.9 g/dL (ref 30.0–36.0)
MCV: 96.7 fL (ref 78.0–100.0)
Monocytes Absolute: 1 10*3/uL (ref 0.1–1.0)
Monocytes Relative: 9 %
NEUTROS PCT: 64 %
Neutro Abs: 7.3 10*3/uL (ref 1.7–7.7)
Platelets: 204 10*3/uL (ref 150–400)
RBC: 5.15 MIL/uL (ref 4.22–5.81)
RDW: 14.5 % (ref 11.5–15.5)
WBC: 11.4 10*3/uL — AB (ref 4.0–10.5)

## 2017-01-27 LAB — RAPID URINE DRUG SCREEN, HOSP PERFORMED
AMPHETAMINES: NOT DETECTED
BARBITURATES: NOT DETECTED
BENZODIAZEPINES: POSITIVE — AB
COCAINE: NOT DETECTED
Opiates: POSITIVE — AB
Tetrahydrocannabinol: POSITIVE — AB

## 2017-01-27 LAB — I-STAT CHEM 8, ED
BUN: 13 mg/dL (ref 6–20)
CREATININE: 1.2 mg/dL (ref 0.61–1.24)
Calcium, Ion: 1.04 mmol/L — ABNORMAL LOW (ref 1.15–1.40)
Chloride: 102 mmol/L (ref 101–111)
Glucose, Bld: 97 mg/dL (ref 65–99)
HCT: 52 % (ref 39.0–52.0)
HEMOGLOBIN: 17.7 g/dL — AB (ref 13.0–17.0)
POTASSIUM: 3.6 mmol/L (ref 3.5–5.1)
Sodium: 140 mmol/L (ref 135–145)
TCO2: 26 mmol/L (ref 0–100)

## 2017-01-27 LAB — ETHANOL: ALCOHOL ETHYL (B): 284 mg/dL — AB (ref <5)

## 2017-01-27 MED ORDER — IOPAMIDOL (ISOVUE-300) INJECTION 61%
100.0000 mL | Freq: Once | INTRAVENOUS | Status: AC | PRN
  Administered 2017-01-27: 100 mL via INTRAVENOUS

## 2017-01-27 MED ORDER — SODIUM CHLORIDE 0.9 % IV BOLUS (SEPSIS)
500.0000 mL | Freq: Once | INTRAVENOUS | Status: AC
  Administered 2017-01-27: 500 mL via INTRAVENOUS

## 2017-01-27 NOTE — ED Notes (Signed)
Pt ambulated to restroom & returned to treatment room. Family in room. Pt requesting pain medication for his chest. EDP made aware.

## 2017-01-27 NOTE — ED Notes (Addendum)
Pt aware that his wife & daughter in law know he is in the ED.
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
## 2017-06-04 ENCOUNTER — Encounter (HOSPITAL_COMMUNITY): Payer: Self-pay | Admitting: *Deleted

## 2017-06-04 ENCOUNTER — Emergency Department (HOSPITAL_COMMUNITY)
Admission: EM | Admit: 2017-06-04 | Discharge: 2017-06-04 | Disposition: A | Discharge status: Home / Self Care | Payer: BLUE CROSS/BLUE SHIELD | Attending: Emergency Medicine | Admitting: Emergency Medicine

## 2017-06-04 DIAGNOSIS — M5442 Lumbago with sciatica, left side: Secondary | ICD-10-CM | POA: Insufficient documentation

## 2017-06-04 DIAGNOSIS — I10 Essential (primary) hypertension: Secondary | ICD-10-CM | POA: Diagnosis not present

## 2017-06-04 DIAGNOSIS — F17200 Nicotine dependence, unspecified, uncomplicated: Secondary | ICD-10-CM | POA: Insufficient documentation

## 2017-06-04 DIAGNOSIS — M545 Low back pain: Secondary | ICD-10-CM | POA: Diagnosis present

## 2017-06-04 DIAGNOSIS — J449 Chronic obstructive pulmonary disease, unspecified: Secondary | ICD-10-CM | POA: Insufficient documentation

## 2017-06-04 DIAGNOSIS — Z79899 Other long term (current) drug therapy: Secondary | ICD-10-CM | POA: Insufficient documentation

## 2017-06-04 DIAGNOSIS — G8929 Other chronic pain: Secondary | ICD-10-CM

## 2017-06-04 MED ORDER — CYCLOBENZAPRINE HCL 10 MG PO TABS
10.0000 mg | ORAL_TABLET | Freq: Once | ORAL | Status: AC
  Administered 2017-06-04: 10 mg via ORAL
  Filled 2017-06-04: qty 1

## 2017-06-04 MED ORDER — MORPHINE SULFATE (PF) 4 MG/ML IV SOLN
6.0000 mg | Freq: Once | INTRAVENOUS | Status: AC
  Administered 2017-06-04: 6 mg via INTRAMUSCULAR
  Filled 2017-06-04: qty 2

## 2017-06-04 NOTE — Discharge Instructions (Signed)
Follow-up this week with your primary doctor

## 2017-06-04 NOTE — ED Provider Notes (Signed)
[facility]-EMERGENCY DEPT Provider Note   CSN: [id number] Arrival date & time: 06/04/17  1805     History   Chief Complaint Chief Complaint  Patient presents with  . Back Pain  . Medication Refill    HPI William Franklin is a 61 y.o. male.  HPI   William Franklin is a 61 y.o. male history of chronic low back pain,  presents to the Emergency Department complaining of left-sided low back pain that has been worsening for 3 days. He states he ran out of his pain medication on Saturday. He contacted his PCP at the [facility] to try to get a refill and was told that his provider is no longer employed there. He is arranged a follow-up appointment for Friday. He describes a sharp throbbing pain across his lower back that radiates to the level of his left thigh. Pain is worse with weightbearing. Pain improves at rest. He denies recent injury, abdominal pain, urine or bowel changes, numbness or weakness of the lower extremities.   Past Medical History:  Diagnosis Date  . Atrial fibrillation (HCC)   . COPD (chronic obstructive pulmonary disease) (HCC)   . Hypercholesteremia   . Hypertension   . Rib fracture 03/24/2013    There are no active problems to display for this patient.   Past Surgical History:  Procedure Laterality Date  . HERNIA REPAIR         Home Medications    Prior to Admission medications   Medication Sig Start Date End Date Taking? Authorizing Provider  atorvastatin (LIPITOR) 80 MG tablet Take 40 mg by mouth at bedtime.    [provider]  cephALEXin (KEFLEX) 500 MG capsule Take 1 capsule (500 mg total) by mouth 4 (four) times daily. For 7 days 04/04/13   Eyan Hagood, PA-C  cyclobenzaprine (FLEXERIL) 10 MG tablet Take 1 tablet (10 mg total) by mouth 3 (three) times daily. 05/28/16   Ivery QualeBryant, Hobson, PA-C  dexamethasone (DECADRON) 4 MG tablet Take 1 tablet (4 mg total) by mouth 2 (two) times daily with a meal. 05/28/16   Ivery QualeBryant, Hobson, PA-C  diclofenac (VOLTAREN) 75 MG EC  tablet Take 1 tablet (75 mg total) by mouth 2 (two) times daily. 05/28/16   Ivery QualeBryant, Hobson, PA-C  diltiazem (CARDIZEM) 120 MG tablet Take 120 mg by mouth daily.    [provider]  flunisolide (NASAREL) 29 MCG/ACT (0.025%) nasal spray Place 2 sprays into the nose daily.     [provider]  hydrochlorothiazide (HYDRODIURIL) 25 MG tablet Take 25 mg by mouth daily.    [provider]  HYDROcodone-acetaminophen (NORCO/VICODIN) 5-325 MG per tablet Take one-two tabs po q 4-6 hrs prn pain 04/04/13   Alyx Mcguirk, PA-C  leflunomide (ARAVA) 10 MG tablet Take 5 mg by mouth daily.  03/05/13 03/05/14  [provider]  lisinopril (PRINIVIL,ZESTRIL) 40 MG tablet 40 mg. Take 40 mg by mouth daily.    [provider]  Multiple Vitamin (MULTIVITAMIN WITH MINERALS) TABS Take 1 tablet by mouth daily.    [provider]  tacrolimus (PROTOPIC) 0.03 % ointment Apply 1 application topically 2 (two) times daily.    [provider]  temazepam (RESTORIL) 30 MG capsule 30 mg. Take 30 mg by mouth at bedtime as needed for Sleep.    [provider]  traMADol (ULTRAM) 50 MG tablet Take 1 tablet (50 mg total) by mouth every 6 (six) hours as needed. 05/28/16   Ivery QualeBryant, Hobson, PA-C    Family History  No family history on file.  Social History Social History  Substance Use Topics  . Smoking status: Current Every Day Smoker  . Smokeless tobacco: Never Used  . Alcohol use No     Allergies   Patient has no known allergies.   Review of Systems Review of Systems  Constitutional: Negative for fever.  Respiratory: Negative for shortness of breath.   Gastrointestinal: Negative for abdominal pain, constipation and vomiting.  Genitourinary: Negative for decreased urine volume, difficulty urinating, dysuria, flank pain and hematuria.  Musculoskeletal: Positive for back pain. Negative for joint swelling.  Skin: Negative for rash.  Neurological: Negative for  weakness and numbness.  All other systems reviewed and are negative.    Physical Exam Updated Vital Signs BP (!) 143/69 (BP Location: Right Arm)   Pulse 63   Temp 98.4 F (36.9 C) (Oral)   Resp 18   Ht 6\' 2"  (1.88 m)   Wt 108.9 kg (240 lb)   SpO2 97%   BMI 30.81 kg/m   Physical Exam  Constitutional: He is oriented to person, place, and time. He appears well-developed and well-nourished. No distress.  HENT:  Head: Normocephalic and atraumatic.  Neck: Normal range of motion. Neck supple.  Cardiovascular: Normal rate, regular rhythm and intact distal pulses.   No murmur heard. Pulmonary/Chest: Effort normal and breath sounds normal. No respiratory distress.  Abdominal: Soft. He exhibits no distension. There is no tenderness.  Musculoskeletal: He exhibits tenderness. He exhibits no edema.       Lumbar back: He exhibits tenderness and pain. He exhibits normal range of motion, no swelling, no deformity, no laceration and normal pulse.  ttp of the lower lumbar spine and left paraspinal muscles.  Positive straight leg raise bilaterally at 20.  Pt has 5/5 strength against resistance of bilateral lower extremities.     Neurological: He is alert and oriented to person, place, and time. He has normal strength. No sensory deficit. He exhibits normal muscle tone. Coordination and gait normal.  Reflex Scores:      Patellar reflexes are 2+ on the right side and 2+ on the left side.      Achilles reflexes are 2+ on the right side and 2+ on the left side. Skin: Skin is warm and dry. Capillary refill takes less than 2 seconds. No rash noted.  Psychiatric: He has a normal mood and affect.  Nursing note and vitals reviewed.    ED Treatments / Results  Labs (all labs ordered are listed, but only abnormal results are displayed) Labs Reviewed - No data to display  EKG  EKG Interpretation None       Radiology No results found.  Procedures Procedures (including critical care  time)  Medications Ordered in ED Medications - No data to display   Initial Impression / Assessment and Plan / ED Course  I have reviewed the triage vital signs and the nursing notes.  Pertinent labs & imaging results that were available during my care of the patient were reviewed by me and considered in my medical decision making (see chart for details).     Reviewed on the [facility], received #90 tramadol 50 mg on 05/15/2017 for a 30 day supply.  Patient with likely acute on chronic low back pain with left-sided radiculopathy which is also chronic. No focal neuro deficits on exam. No concerning sx's for emergent neurological process.  Patient is ambulatory, gait steady, has an appointment with his PCP at the [facility] on Friday. NO concerning  sx's for emergent neurological process. Patient appears stable for discharge. Return precautions discussed.  He understands that pain will be addressed here this evening, but I do not feel that narcotic prescriptions are indicated.    Final Clinical Impressions(s) / ED Diagnoses   Final diagnoses:  Chronic left-sided low back pain with left-sided sciatica    New Prescriptions New Prescriptions   No medications on file     Pauline Aus, Cordelia Poche 06/04/17 2058    Mesner, Barbara Cower, MD 06/07/17 [phone]

## 2017-06-04 NOTE — ED Triage Notes (Signed)
Pt comes in with chronic back pain. States he ran out of medication on Saturday. [facility] and [facility] left the facility. He has an appt with them but his pain is too bad and he cant wait.

## 2017-07-06 ENCOUNTER — Emergency Department (HOSPITAL_COMMUNITY)
Admission: EM | Admit: 2017-07-06 | Discharge: 2017-07-06 | Disposition: A | Discharge status: Home / Self Care | Payer: BLUE CROSS/BLUE SHIELD | Attending: Emergency Medicine | Admitting: Emergency Medicine

## 2017-07-06 ENCOUNTER — Encounter (HOSPITAL_COMMUNITY): Payer: Self-pay | Admitting: Emergency Medicine

## 2017-07-06 DIAGNOSIS — Z79899 Other long term (current) drug therapy: Secondary | ICD-10-CM | POA: Insufficient documentation

## 2017-07-06 DIAGNOSIS — F17210 Nicotine dependence, cigarettes, uncomplicated: Secondary | ICD-10-CM | POA: Insufficient documentation

## 2017-07-06 DIAGNOSIS — G8929 Other chronic pain: Secondary | ICD-10-CM | POA: Insufficient documentation

## 2017-07-06 DIAGNOSIS — I10 Essential (primary) hypertension: Secondary | ICD-10-CM | POA: Insufficient documentation

## 2017-07-06 DIAGNOSIS — J449 Chronic obstructive pulmonary disease, unspecified: Secondary | ICD-10-CM | POA: Insufficient documentation

## 2017-07-06 DIAGNOSIS — M5442 Lumbago with sciatica, left side: Secondary | ICD-10-CM | POA: Insufficient documentation

## 2017-07-06 MED ORDER — HYDROCODONE-ACETAMINOPHEN 5-325 MG PO TABS: ORAL_TABLET | ORAL | 0 refills | Status: DC

## 2017-07-06 MED ORDER — CYCLOBENZAPRINE HCL 10 MG PO TABS
10.0000 mg | ORAL_TABLET | Freq: Once | ORAL | Status: AC
  Administered 2017-07-06: 10 mg via ORAL
  Filled 2017-07-06: qty 1

## 2017-07-06 MED ORDER — PREDNISONE 10 MG PO TABS
60.0000 mg | ORAL_TABLET | Freq: Once | ORAL | Status: AC
  Administered 2017-07-06: 60 mg via ORAL
  Filled 2017-07-06: qty 1

## 2017-07-06 MED ORDER — PREDNISONE 10 MG PO TABS: ORAL_TABLET | ORAL | 0 refills | Status: DC

## 2017-07-06 MED ORDER — CYCLOBENZAPRINE HCL 10 MG PO TABS: 10.0000 mg | ORAL_TABLET | Freq: Three times a day (TID) | ORAL | 0 refills | Status: DC | PRN

## 2017-07-06 MED ORDER — MORPHINE SULFATE (PF) 4 MG/ML IV SOLN
6.0000 mg | Freq: Once | INTRAVENOUS | Status: AC
  Administered 2017-07-06: 6 mg via INTRAMUSCULAR
  Filled 2017-07-06: qty 2

## 2017-07-06 NOTE — ED Notes (Signed)
Pt ambulated to desk while waiting for EDPA asking how much longer it will be, gait steady.  EDPA informed pt that she would be in there in a minute.  Pt here for c/o back pain.

## 2017-07-06 NOTE — ED Triage Notes (Signed)
Pt reports back pain after attempting to fix a plumbing issue. Has multiple herniated discs, is to have spinal fusion on Friday. Denies any new numbness, bowel/bladder dysfunction, or weakness.

## 2017-07-06 NOTE — Discharge Instructions (Signed)
Alternate ice and heat to your back.  Keep your appt on Wednesday with your neurosurgeon

## 2017-07-06 NOTE — ED Provider Notes (Signed)
[facility]-EMERGENCY DEPT Provider Note   CSN: [id number] Arrival date & time: 07/06/17  1703     History   Chief Complaint Chief Complaint  Patient presents with  . Back Pain    HPI William Franklin is a 61 y.o. male.  HPI   William Franklin is a 61 y.o. male with hx of low back pain,  presents to the Emergency Department complaining of worsening pain for one day.  Pain worse after being under his house attempting to fix a plumbing issue on his house.  States that he has several herniated discs of the lumbar spine and persistent sharp, burning pain into the left leg.  Symptoms have been present for months, he has an appt next week with neurosurgery at [facility].  He denies numbness of the extremities, abd pain, urine or bowel changes, fever.   Past Medical History:  Diagnosis Date  . Atrial fibrillation (HCC)   . COPD (chronic obstructive pulmonary disease) (HCC)   . Hypercholesteremia   . Hypertension   . Rib fracture 03/24/2013    There are no active problems to display for this patient.   Past Surgical History:  Procedure Laterality Date  . HERNIA REPAIR         Home Medications    Prior to Admission medications   Medication Sig Start Date End Date Taking? Authorizing Provider  atorvastatin (LIPITOR) 80 MG tablet Take 40 mg by mouth at bedtime.    [provider]  cephALEXin (KEFLEX) 500 MG capsule Take 1 capsule (500 mg total) by mouth 4 (four) times daily. For 7 days 04/04/13   Demone Lyles, PA-C  cyclobenzaprine (FLEXERIL) 10 MG tablet Take 1 tablet (10 mg total) by mouth 3 (three) times daily. 05/28/16   Ivery Quale, PA-C  dexamethasone (DECADRON) 4 MG tablet Take 1 tablet (4 mg total) by mouth 2 (two) times daily with a meal. 05/28/16   Ivery Quale, PA-C  diclofenac (VOLTAREN) 75 MG EC tablet Take 1 tablet (75 mg total) by mouth 2 (two) times daily. 05/28/16   Ivery Quale, PA-C  diltiazem (CARDIZEM) 120 MG tablet Take 120 mg by mouth daily.    [provider]  flunisolide (NASAREL) 29 MCG/ACT (0.025%) nasal spray Place 2 sprays into the nose daily.     [provider]  hydrochlorothiazide (HYDRODIURIL) 25 MG tablet Take 25 mg by mouth daily.    [provider]  HYDROcodone-acetaminophen (NORCO/VICODIN) 5-325 MG per tablet Take one-two tabs po q 4-6 hrs prn pain 04/04/13   Dallas Scorsone, PA-C  leflunomide (ARAVA) 10 MG tablet Take 5 mg by mouth daily.  03/05/13 03/05/14  [provider]  lisinopril (PRINIVIL,ZESTRIL) 40 MG tablet 40 mg. Take 40 mg by mouth daily.    [provider]  Multiple Vitamin (MULTIVITAMIN WITH MINERALS) TABS Take 1 tablet by mouth daily.    [provider]  tacrolimus (PROTOPIC) 0.03 % ointment Apply 1 application topically 2 (two) times daily.    [provider]  temazepam (RESTORIL) 30 MG capsule 30 mg. Take 30 mg by mouth at bedtime as needed for Sleep.    [provider]  traMADol (ULTRAM) 50 MG tablet Take 1 tablet (50 mg total) by mouth every 6 (six) hours as needed. 05/28/16   Ivery Quale, PA-C    Family History No family history on file.  Social History Social History  Substance Use Topics  . Smoking status: Current Every Day Smoker    Packs/day: 1.00  Years: 30.00    Types: Cigarettes  . Smokeless tobacco: Never Used  . Alcohol use No     Allergies   Patient has no known allergies.   Review of Systems Review of Systems  Constitutional: Negative for fever.  Respiratory: Negative for shortness of breath.   Gastrointestinal: Negative for abdominal pain, constipation and vomiting.  Genitourinary: Negative for decreased urine volume, difficulty urinating, dysuria, flank pain and hematuria.  Musculoskeletal: Positive for back pain. Negative for joint swelling.  Skin: Negative for rash.  Neurological: Negative for weakness and numbness.  All other systems reviewed and are negative.    Physical Exam Updated Vital Signs BP  131/82 (BP Location: Left Arm)   Pulse 68   Temp 98.6 F (37 C) (Oral)   Resp 20   Ht  (1.854 m)   Wt 108.9 kg (240 lb)   SpO2 97%   BMI 31.66 kg/m   Physical Exam  Constitutional: He is oriented to person, place, and time. He appears well-developed and well-nourished. No distress.  HENT:  Head: Normocephalic and atraumatic.  Neck: Normal range of motion. Neck supple.  Cardiovascular: Normal rate, regular rhythm, normal heart sounds and intact distal pulses.   No murmur heard. Pulmonary/Chest: Effort normal and breath sounds normal. No respiratory distress.  Abdominal: Soft. He exhibits no distension. There is no tenderness.  Musculoskeletal: He exhibits tenderness. He exhibits no edema.       Lumbar back: He exhibits tenderness and pain. He exhibits normal range of motion, no swelling, no deformity, no laceration and normal pulse.  ttp of the lower lumbar spine and left paraspinal muscles and SI joint.   Pt has 5/5 strength against resistance of bilateral lower extremities.     Neurological: He is alert and oriented to person, place, and time. He has normal strength. No sensory deficit. He exhibits normal muscle tone. Coordination and gait normal.  Reflex Scores:      Patellar reflexes are 2+ on the right side and 2+ on the left side.      Achilles reflexes are 2+ on the right side and 2+ on the left side. Skin: Skin is warm and dry. No rash noted.  Nursing note and vitals reviewed.    ED Treatments / Results  Labs (all labs ordered are listed, but only abnormal results are displayed) Labs Reviewed - No data to display  EKG  EKG Interpretation None       Radiology No results found.  Procedures Procedures (including critical care time)  Medications Ordered in ED Medications  morphine 4 MG/ML injection 6 mg (6 mg Intramuscular Given 07/06/17 1848)  cyclobenzaprine (FLEXERIL) tablet 10 mg (10 mg Oral Given 07/06/17 1847)  predniSONE (DELTASONE) tablet 60 mg (60  mg Oral Given 07/06/17 1847)     Initial Impression / Assessment and Plan / ED Course  I have reviewed the triage vital signs and the nursing notes.  Pertinent labs & imaging results that were available during my care of the patient were reviewed by me and considered in my medical decision making (see chart for details).     Pt with likely acute excerbation of chronic low back pain.  Has appt Wednesday for neurosurgery at [facility].    Pt reviewed on the narcotic database, no rx's filed since August for one month supply  Pt is ambulatory, steady gait.  no focal neuro deficits.  No concerning sx's for cauda equina or spinal abscess.   Final Clinical Impressions(s) / ED  Diagnoses   Final diagnoses:  Chronic left-sided low back pain with left-sided sciatica    New Prescriptions New Prescriptions   No medications on file     Rosey Bath 07/08/17 1306    Eber Hong, MD 07/09/17 2012

## 2017-07-31 ENCOUNTER — Inpatient Hospital Stay (HOSPITAL_COMMUNITY)
Admission: EM | Admit: 2017-07-31 | Discharge: 2017-08-02 | DRG: 309 | Disposition: A | Discharge status: Home / Self Care | Payer: Non-veteran care | Attending: Internal Medicine | Admitting: Internal Medicine

## 2017-07-31 ENCOUNTER — Encounter (HOSPITAL_COMMUNITY): Payer: Self-pay | Admitting: Emergency Medicine

## 2017-07-31 DIAGNOSIS — I4891 Unspecified atrial fibrillation: Secondary | ICD-10-CM | POA: Diagnosis not present

## 2017-07-31 DIAGNOSIS — D72829 Elevated white blood cell count, unspecified: Secondary | ICD-10-CM | POA: Diagnosis present

## 2017-07-31 DIAGNOSIS — I10 Essential (primary) hypertension: Secondary | ICD-10-CM | POA: Diagnosis present

## 2017-07-31 DIAGNOSIS — F17210 Nicotine dependence, cigarettes, uncomplicated: Secondary | ICD-10-CM | POA: Diagnosis present

## 2017-07-31 DIAGNOSIS — F10929 Alcohol use, unspecified with intoxication, unspecified: Secondary | ICD-10-CM

## 2017-07-31 DIAGNOSIS — S40012A Contusion of left shoulder, initial encounter: Secondary | ICD-10-CM | POA: Diagnosis present

## 2017-07-31 DIAGNOSIS — Z7982 Long term (current) use of aspirin: Secondary | ICD-10-CM

## 2017-07-31 DIAGNOSIS — M549 Dorsalgia, unspecified: Secondary | ICD-10-CM | POA: Diagnosis present

## 2017-07-31 DIAGNOSIS — W19XXXA Unspecified fall, initial encounter: Secondary | ICD-10-CM | POA: Diagnosis present

## 2017-07-31 DIAGNOSIS — Y92009 Unspecified place in unspecified non-institutional (private) residence as the place of occurrence of the external cause: Secondary | ICD-10-CM

## 2017-07-31 DIAGNOSIS — F10229 Alcohol dependence with intoxication, unspecified: Secondary | ICD-10-CM | POA: Diagnosis present

## 2017-07-31 DIAGNOSIS — Z79899 Other long term (current) drug therapy: Secondary | ICD-10-CM

## 2017-07-31 DIAGNOSIS — E7800 Pure hypercholesterolemia, unspecified: Secondary | ICD-10-CM | POA: Diagnosis present

## 2017-07-31 DIAGNOSIS — G8929 Other chronic pain: Secondary | ICD-10-CM | POA: Diagnosis present

## 2017-07-31 DIAGNOSIS — R45851 Suicidal ideations: Secondary | ICD-10-CM | POA: Diagnosis present

## 2017-07-31 DIAGNOSIS — J449 Chronic obstructive pulmonary disease, unspecified: Secondary | ICD-10-CM | POA: Diagnosis present

## 2017-07-31 DIAGNOSIS — Z23 Encounter for immunization: Secondary | ICD-10-CM

## 2017-07-31 DIAGNOSIS — F329 Major depressive disorder, single episode, unspecified: Secondary | ICD-10-CM | POA: Diagnosis present

## 2017-07-31 HISTORY — DX: Other chronic pain: G89.29

## 2017-07-31 HISTORY — DX: Sciatica, unspecified side: M54.30

## 2017-07-31 HISTORY — DX: Dorsalgia, unspecified: M54.9

## 2017-07-31 LAB — CBC WITH DIFFERENTIAL/PLATELET
BASOS ABS: 0.1 10*3/uL (ref 0.0–0.1)
Basophils Relative: 0 %
EOS PCT: 2 %
Eosinophils Absolute: 0.3 10*3/uL (ref 0.0–0.7)
HCT: 49.8 % (ref 39.0–52.0)
HEMOGLOBIN: 17.6 g/dL — AB (ref 13.0–17.0)
LYMPHS ABS: 4 10*3/uL (ref 0.7–4.0)
LYMPHS PCT: 32 %
MCH: 34 pg (ref 26.0–34.0)
MCHC: 35.3 g/dL (ref 30.0–36.0)
MCV: 96.3 fL (ref 78.0–100.0)
Monocytes Absolute: 0.7 10*3/uL (ref 0.1–1.0)
Monocytes Relative: 6 %
NEUTROS PCT: 60 %
Neutro Abs: 7.4 10*3/uL (ref 1.7–7.7)
PLATELETS: 262 10*3/uL (ref 150–400)
RBC: 5.17 MIL/uL (ref 4.22–5.81)
RDW: 13.6 % (ref 11.5–15.5)
WBC: 12.5 10*3/uL — AB (ref 4.0–10.5)

## 2017-07-31 LAB — COMPREHENSIVE METABOLIC PANEL
ALT: 25 U/L (ref 17–63)
ANION GAP: 15 (ref 5–15)
AST: 24 U/L (ref 15–41)
Albumin: 4 g/dL (ref 3.5–5.0)
Alkaline Phosphatase: 110 U/L (ref 38–126)
BUN: 15 mg/dL (ref 6–20)
CHLORIDE: 98 mmol/L — AB (ref 101–111)
CO2: 20 mmol/L — AB (ref 22–32)
Calcium: 8.1 mg/dL — ABNORMAL LOW (ref 8.9–10.3)
Creatinine, Ser: 0.89 mg/dL (ref 0.61–1.24)
Glucose, Bld: 92 mg/dL (ref 65–99)
POTASSIUM: 3.6 mmol/L (ref 3.5–5.1)
SODIUM: 133 mmol/L — AB (ref 135–145)
Total Bilirubin: 0.6 mg/dL (ref 0.3–1.2)
Total Protein: 7.3 g/dL (ref 6.5–8.1)

## 2017-07-31 LAB — ETHANOL: ALCOHOL ETHYL (B): 260 mg/dL — AB (ref <10)

## 2017-07-31 NOTE — ED Notes (Signed)
Pt oxygen saturation steady mid 80s. Placed pt on 3L of oxygen [place]. EDP notified. Sitter at bedside. Pt heart rate and oxygen saturation currently being monitored. EKG in progress

## 2017-07-31 NOTE — ED Provider Notes (Addendum)
[facility] EMERGENCY DEPARTMENT Provider Note   CSN: [id number] Arrival date & time: 07/31/17  2031     History   Chief Complaint Chief Complaint  Patient presents with  . V70.1    HPI William Franklin is a 61 y.o. male.  The history is provided by the patient. No language interpreter was used.    William Franklin is a 61 y.o. male who presents to the Emergency Department complaining of alcohol intoxication, SI.  He called 911 today because he stated he needed help.  He states that he has been on a binge and has been drinking alcohol since Friday.  He makes moonshine at home and his drink over half a gallon last several days.  He states that he does not want to live anymore because he cannot provide for his wife.  He wishes to go to rehab at the [facility] in [place].  He denies any abdominal pain.  He states he has chronic back pain and is scheduled for surgery in several weeks.  Level V caveat due to intoxication.  Past Medical History:  Diagnosis Date  . Atrial fibrillation (HCC)   . Chronic back pain   . COPD (chronic obstructive pulmonary disease) (HCC)   . Hypercholesteremia   . Hypertension   . Rib fracture 03/24/2013  . Sciatica     There are no active problems to display for this patient.   Past Surgical History:  Procedure Laterality Date  . HERNIA REPAIR         Home Medications    Prior to Admission medications   Medication Sig Start Date End Date Taking? Authorizing Provider  atorvastatin (LIPITOR) 80 MG tablet Take 40 mg by mouth at bedtime.    [provider]  cephALEXin (KEFLEX) 500 MG capsule Take 1 capsule (500 mg total) by mouth 4 (four) times daily. For 7 days 04/04/13   Triplett, Tammy, PA-C  cyclobenzaprine (FLEXERIL) 10 MG tablet Take 1 tablet (10 mg total) by mouth 3 (three) times daily as needed. 07/06/17   Triplett, Tammy, PA-C  dexamethasone (DECADRON) 4 MG tablet Take 1 tablet (4 mg total) by mouth 2 (two) times daily with a meal. 05/28/16    Ivery QualeBryant, [place], PA-C  diltiazem (CARDIZEM) 120 MG tablet Take 120 mg by mouth daily.    [provider]  flunisolide (NASAREL) 29 MCG/ACT (0.025%) nasal spray Place 2 sprays into the nose daily.     [provider]  hydrochlorothiazide (HYDRODIURIL) 25 MG tablet Take 25 mg by mouth daily.    [provider]  HYDROcodone-acetaminophen (NORCO/VICODIN) 5-325 MG tablet Take one tab po q 4-6 hrs prn pain 07/06/17   Triplett, Tammy, PA-C  leflunomide (ARAVA) 10 MG tablet Take 5 mg by mouth daily.  03/05/13 03/05/14  [provider]  lisinopril (PRINIVIL,ZESTRIL) 40 MG tablet 40 mg. Take 40 mg by mouth daily.    [provider]  Multiple Vitamin (MULTIVITAMIN WITH MINERALS) TABS Take 1 tablet by mouth daily.    [provider]  predniSONE (DELTASONE) 10 MG tablet Take 6 tablets day one, 5 tablets day two, 4 tablets day three, 3 tablets day four, 2 tablets day five, then 1 tablet day six 07/06/17   Triplett, Tammy, PA-C  tacrolimus (PROTOPIC) 0.03 % ointment Apply 1 application topically 2 (two) times daily.    [provider]  temazepam (RESTORIL) 30 MG capsule 30 mg. Take 30 mg by mouth at bedtime as needed for Sleep.    [provider]    Family History History reviewed. No pertinent family history.  Social History Social History  Substance Use Topics  . Smoking status: Current Every Day Smoker    Packs/day: 1.00    Years: 30.00    Types: Cigarettes  . Smokeless tobacco: Never Used  . Alcohol use Yes     Comment: half a gallon of moonshine      Allergies   Patient has no known allergies.   Review of Systems Review of Systems  All other systems reviewed and are negative.    Physical Exam Updated Vital Signs BP (!) 131/96 (BP Location: Right Arm)   Pulse (!) 47   Temp 98.6 F (37 C) (Oral)   Resp 16   Ht 6\' 1"  (1.854 m)   Wt 108.9 kg (240 lb)   SpO2 98%   BMI 31.66 kg/m   Physical Exam  Constitutional: He  is oriented to person, place, and time. He appears well-developed and well-nourished.  Slurred speech  HENT:  Head: Normocephalic.  Right-sided periorbital ecchymoses  Cardiovascular: Normal rate.   No murmur heard. Pulmonary/Chest: Effort normal. No respiratory distress.  Abdominal: Soft. There is no tenderness. There is no rebound and no guarding.  Musculoskeletal: He exhibits no edema or tenderness.  Neurological: He is alert and oriented to person, place, and time.  Skin: Skin is warm and dry.  Psychiatric:  Tearful.  Endorses SI. No plan.  Nursing note and vitals reviewed.    ED Treatments / Results  Labs (all labs ordered are listed, but only abnormal results are displayed) Labs Reviewed  RAPID URINE DRUG SCREEN, HOSP PERFORMED  COMPREHENSIVE METABOLIC PANEL  ETHANOL  CBC WITH DIFFERENTIAL/PLATELET    EKG  EKG Interpretation None       Radiology No results found.  Procedures Procedures (including critical care time)  Medications Ordered in ED Medications - No data to display   Initial Impression / Assessment and Plan / ED Course  I have reviewed the triage vital signs and the nursing notes.  Pertinent labs & imaging results that were available during my care of the patient were reviewed by me and considered in my medical decision making (see chart for details).     Patient with history of alcohol abuse as well as chronic back pain here with alcohol intoxication and passive SI.  He is intoxicated in the emergency department.  He is calm and appropriate.  Plan to check labs and allow alcohol to metabolize followed by reassessment.  Patient care transferred pending labs and reassessment.  Pt with hx/o afib, not on anticoagulation.  On repeat assessment he was noted to be tachycardic, asymptomatic.  He is unsure if he missed a dose cardizem - will provide home dose and recheck.   Final Clinical Impressions(s) / ED Diagnoses   Final diagnoses:  None     New Prescriptions New Prescriptions   No medications on file     Tilden Fossa, MD 07/31/17 2259    Tilden Fossa, MD 08/01/17 [phone]

## 2017-07-31 NOTE — ED Triage Notes (Signed)
Pt states he has been drinking for three days. Pt came in via EMS with an ETOH of 0.24. Pt CBG before arrival was 91. Pt complaining of generalized pain. Pt states he is suicidal and homicidal. Pt states "He could shoot someone right now"

## 2017-07-31 NOTE — ED Notes (Signed)
Pt calm and resting. Phlebotomy at bedside.

## 2017-08-01 ENCOUNTER — Emergency Department (HOSPITAL_COMMUNITY): Payer: Non-veteran care

## 2017-08-01 ENCOUNTER — Encounter (HOSPITAL_COMMUNITY): Payer: Self-pay

## 2017-08-01 DIAGNOSIS — F329 Major depressive disorder, single episode, unspecified: Secondary | ICD-10-CM | POA: Diagnosis not present

## 2017-08-01 DIAGNOSIS — R45851 Suicidal ideations: Secondary | ICD-10-CM | POA: Diagnosis not present

## 2017-08-01 DIAGNOSIS — Y92009 Unspecified place in unspecified non-institutional (private) residence as the place of occurrence of the external cause: Secondary | ICD-10-CM | POA: Diagnosis not present

## 2017-08-01 DIAGNOSIS — I4891 Unspecified atrial fibrillation: Secondary | ICD-10-CM | POA: Diagnosis present

## 2017-08-01 DIAGNOSIS — J449 Chronic obstructive pulmonary disease, unspecified: Secondary | ICD-10-CM | POA: Diagnosis not present

## 2017-08-01 DIAGNOSIS — I10 Essential (primary) hypertension: Secondary | ICD-10-CM | POA: Diagnosis not present

## 2017-08-01 DIAGNOSIS — Z7982 Long term (current) use of aspirin: Secondary | ICD-10-CM | POA: Diagnosis not present

## 2017-08-01 DIAGNOSIS — F10229 Alcohol dependence with intoxication, unspecified: Secondary | ICD-10-CM | POA: Diagnosis not present

## 2017-08-01 DIAGNOSIS — D72829 Elevated white blood cell count, unspecified: Secondary | ICD-10-CM

## 2017-08-01 DIAGNOSIS — F17210 Nicotine dependence, cigarettes, uncomplicated: Secondary | ICD-10-CM | POA: Diagnosis not present

## 2017-08-01 DIAGNOSIS — S40012A Contusion of left shoulder, initial encounter: Secondary | ICD-10-CM | POA: Diagnosis not present

## 2017-08-01 DIAGNOSIS — W19XXXA Unspecified fall, initial encounter: Secondary | ICD-10-CM | POA: Diagnosis not present

## 2017-08-01 DIAGNOSIS — F10920 Alcohol use, unspecified with intoxication, uncomplicated: Secondary | ICD-10-CM

## 2017-08-01 DIAGNOSIS — Z79899 Other long term (current) drug therapy: Secondary | ICD-10-CM | POA: Diagnosis not present

## 2017-08-01 DIAGNOSIS — E7800 Pure hypercholesterolemia, unspecified: Secondary | ICD-10-CM | POA: Diagnosis not present

## 2017-08-01 DIAGNOSIS — F10929 Alcohol use, unspecified with intoxication, unspecified: Secondary | ICD-10-CM

## 2017-08-01 DIAGNOSIS — Z23 Encounter for immunization: Secondary | ICD-10-CM | POA: Diagnosis not present

## 2017-08-01 DIAGNOSIS — G8929 Other chronic pain: Secondary | ICD-10-CM | POA: Diagnosis not present

## 2017-08-01 DIAGNOSIS — M549 Dorsalgia, unspecified: Secondary | ICD-10-CM | POA: Diagnosis not present

## 2017-08-01 LAB — CREATININE, SERUM
CREATININE: 1.07 mg/dL (ref 0.61–1.24)
GFR calc Af Amer: >60 mL/min (ref >60)

## 2017-08-01 LAB — RAPID URINE DRUG SCREEN, HOSP PERFORMED
Amphetamines: NOT DETECTED
BENZODIAZEPINES: POSITIVE — AB
Barbiturates: NOT DETECTED
Cocaine: NOT DETECTED
OPIATES: NOT DETECTED
Tetrahydrocannabinol: POSITIVE — AB

## 2017-08-01 LAB — CBC
HCT: 47 % (ref 39.0–52.0)
HEMOGLOBIN: 16.4 g/dL (ref 13.0–17.0)
MCH: 33.2 pg (ref 26.0–34.0)
MCHC: 34.9 g/dL (ref 30.0–36.0)
MCV: 95.1 fL (ref 78.0–100.0)
Platelets: 251 10*3/uL (ref 150–400)
RBC: 4.94 MIL/uL (ref 4.22–5.81)
RDW: 13.4 % (ref 11.5–15.5)
WBC: 11 10*3/uL — AB (ref 4.0–10.5)

## 2017-08-01 LAB — TSH: TSH: 3.115 u[IU]/mL (ref 0.350–4.500)

## 2017-08-01 MED ORDER — BUPROPION HCL ER (SR) 150 MG PO TB12
150.0000 mg | ORAL_TABLET | Freq: Two times a day (BID) | ORAL | Status: DC
  Administered 2017-08-01 – 2017-08-02 (×3): 150 mg via ORAL
  Filled 2017-08-01 (×3): qty 1

## 2017-08-01 MED ORDER — ONDANSETRON HCL 4 MG/2ML IJ SOLN: 4.0000 mg | Freq: Four times a day (QID) | INTRAMUSCULAR | Status: DC | PRN

## 2017-08-01 MED ORDER — THIAMINE HCL 100 MG/ML IJ SOLN
Freq: Once | INTRAVENOUS | Status: AC
  Administered 2017-08-01: 11:00:00 via INTRAVENOUS
  Filled 2017-08-01: qty 1000

## 2017-08-01 MED ORDER — ADULT MULTIVITAMIN W/MINERALS CH: 1.0000 | ORAL_TABLET | Freq: Every day | ORAL | Status: DC

## 2017-08-01 MED ORDER — DILTIAZEM LOAD VIA INFUSION
10.0000 mg | Freq: Once | INTRAVENOUS | Status: DC
  Filled 2017-08-01: qty 10

## 2017-08-01 MED ORDER — VITAMIN B-1 100 MG PO TABS
100.0000 mg | ORAL_TABLET | Freq: Every day | ORAL | Status: DC
  Administered 2017-08-02: 100 mg via ORAL
  Filled 2017-08-01: qty 1

## 2017-08-01 MED ORDER — LORAZEPAM 2 MG/ML IJ SOLN: 0.0000 mg | Freq: Four times a day (QID) | INTRAMUSCULAR | Status: DC

## 2017-08-01 MED ORDER — LORAZEPAM 2 MG/ML IJ SOLN: 0.0000 mg | Freq: Two times a day (BID) | INTRAMUSCULAR | Status: DC

## 2017-08-01 MED ORDER — THIAMINE HCL 100 MG/ML IJ SOLN
100.0000 mg | Freq: Every day | INTRAMUSCULAR | Status: DC
  Filled 2017-08-01: qty 2

## 2017-08-01 MED ORDER — FOLIC ACID 1 MG PO TABS
1.0000 mg | ORAL_TABLET | Freq: Every day | ORAL | Status: DC
  Administered 2017-08-01 – 2017-08-02 (×2): 1 mg via ORAL
  Filled 2017-08-01: qty 1

## 2017-08-01 MED ORDER — ALBUTEROL SULFATE (2.5 MG/3ML) 0.083% IN NEBU: 2.5000 mg | INHALATION_SOLUTION | Freq: Four times a day (QID) | RESPIRATORY_TRACT | Status: DC | PRN

## 2017-08-01 MED ORDER — TAMSULOSIN HCL 0.4 MG PO CAPS
0.4000 mg | ORAL_CAPSULE | Freq: Every day | ORAL | Status: DC
  Filled 2017-08-01: qty 1

## 2017-08-01 MED ORDER — LORAZEPAM 2 MG/ML IJ SOLN
1.0000 mg | Freq: Four times a day (QID) | INTRAMUSCULAR | Status: DC | PRN
  Filled 2017-08-01: qty 1

## 2017-08-01 MED ORDER — ASPIRIN EC 325 MG PO TBEC
325.0000 mg | DELAYED_RELEASE_TABLET | Freq: Every day | ORAL | Status: DC
  Administered 2017-08-01 – 2017-08-02 (×2): 325 mg via ORAL
  Filled 2017-08-01 (×2): qty 1

## 2017-08-01 MED ORDER — SODIUM CHLORIDE 0.9 % IV BOLUS (SEPSIS)
500.0000 mL | Freq: Once | INTRAVENOUS | Status: AC
  Administered 2017-08-01: 500 mL via INTRAVENOUS

## 2017-08-01 MED ORDER — LORAZEPAM 1 MG PO TABS
0.0000 mg | ORAL_TABLET | Freq: Two times a day (BID) | ORAL | Status: DC
Start: 2017-08-03 — End: 2017-08-01

## 2017-08-01 MED ORDER — ADULT MULTIVITAMIN W/MINERALS CH
1.0000 | ORAL_TABLET | Freq: Every day | ORAL | Status: DC
  Administered 2017-08-01 – 2017-08-02 (×2): 1 via ORAL
  Filled 2017-08-01: qty 1

## 2017-08-01 MED ORDER — DILTIAZEM HCL ER COATED BEADS 360 MG PO CP24
360.0000 mg | ORAL_CAPSULE | Freq: Once | ORAL | Status: AC
  Administered 2017-08-01: 360 mg via ORAL
  Filled 2017-08-01: qty 1

## 2017-08-01 MED ORDER — LORAZEPAM 2 MG/ML IJ SOLN
0.0000 mg | Freq: Four times a day (QID) | INTRAMUSCULAR | Status: DC
  Administered 2017-08-01: 1 mg via INTRAVENOUS
  Administered 2017-08-01: 2 mg via INTRAVENOUS
  Administered 2017-08-02: 1 mg via INTRAVENOUS
  Filled 2017-08-01 (×2): qty 1

## 2017-08-01 MED ORDER — ENOXAPARIN SODIUM 40 MG/0.4ML ~~LOC~~ SOLN
40.0000 mg | SUBCUTANEOUS | Status: DC
  Administered 2017-08-01: 40 mg via SUBCUTANEOUS
  Filled 2017-08-01 (×2): qty 0.4

## 2017-08-01 MED ORDER — RISPERIDONE 1 MG PO TABS
1.0000 mg | ORAL_TABLET | Freq: Every day | ORAL | Status: DC
  Filled 2017-08-01: qty 1

## 2017-08-01 MED ORDER — THIAMINE HCL 100 MG/ML IJ SOLN
100.0000 mg | Freq: Every day | INTRAMUSCULAR | Status: DC
  Administered 2017-08-01: 100 mg via INTRAVENOUS

## 2017-08-01 MED ORDER — DOCUSATE SODIUM 100 MG PO CAPS
100.0000 mg | ORAL_CAPSULE | Freq: Two times a day (BID) | ORAL | Status: DC
  Filled 2017-08-01 (×2): qty 1

## 2017-08-01 MED ORDER — INFLUENZA VAC SPLIT QUAD 0.5 ML IM SUSY
0.5000 mL | PREFILLED_SYRINGE | INTRAMUSCULAR | Status: AC
  Administered 2017-08-02: 0.5 mL via INTRAMUSCULAR
  Filled 2017-08-01: qty 0.5

## 2017-08-01 MED ORDER — DILTIAZEM HCL ER COATED BEADS 360 MG PO CP24: 360.0000 mg | ORAL_CAPSULE | Freq: Every day | ORAL | Status: DC

## 2017-08-01 MED ORDER — LORAZEPAM 1 MG PO TABS: 0.0000 mg | ORAL_TABLET | Freq: Four times a day (QID) | ORAL | Status: DC

## 2017-08-01 MED ORDER — ATORVASTATIN CALCIUM 40 MG PO TABS
40.0000 mg | ORAL_TABLET | Freq: Every day | ORAL | Status: DC
  Filled 2017-08-01: qty 1

## 2017-08-01 MED ORDER — SODIUM CHLORIDE 0.9 % IV SOLN
INTRAVENOUS | Status: DC
  Administered 2017-08-01 – 2017-08-02 (×3): via INTRAVENOUS

## 2017-08-01 MED ORDER — DILTIAZEM HCL ER COATED BEADS 180 MG PO CP24
360.0000 mg | ORAL_CAPSULE | Freq: Every day | ORAL | Status: DC
  Filled 2017-08-01 (×3): qty 2

## 2017-08-01 MED ORDER — DILTIAZEM HCL-DEXTROSE 100-5 MG/100ML-% IV SOLN (PREMIX): 5.0000 mg/h | INTRAVENOUS | Status: DC

## 2017-08-01 MED ORDER — SODIUM CHLORIDE 0.9 % IV SOLN
INTRAVENOUS | Status: DC
  Administered 2017-08-01: 10:00:00 via INTRAVENOUS

## 2017-08-01 MED ORDER — LISINOPRIL 10 MG PO TABS
40.0000 mg | ORAL_TABLET | Freq: Every day | ORAL | Status: DC
  Filled 2017-08-01: qty 4

## 2017-08-01 MED ORDER — ACETAMINOPHEN 650 MG RE SUPP: 650.0000 mg | Freq: Four times a day (QID) | RECTAL | Status: DC | PRN

## 2017-08-01 MED ORDER — TRAZODONE HCL 50 MG PO TABS: 300.0000 mg | ORAL_TABLET | Freq: Every day | ORAL | Status: DC

## 2017-08-01 MED ORDER — DILTIAZEM HCL ER COATED BEADS 180 MG PO CP24
ORAL_CAPSULE | ORAL | Status: AC
  Filled 2017-08-01: qty 2

## 2017-08-01 MED ORDER — ALBUTEROL SULFATE HFA 108 (90 BASE) MCG/ACT IN AERS
1.0000 | INHALATION_SPRAY | Freq: Four times a day (QID) | RESPIRATORY_TRACT | Status: DC | PRN
  Filled 2017-08-01: qty 6.7

## 2017-08-01 MED ORDER — ACETAMINOPHEN 325 MG PO TABS: 650.0000 mg | ORAL_TABLET | Freq: Four times a day (QID) | ORAL | Status: DC | PRN

## 2017-08-01 MED ORDER — LORAZEPAM 1 MG PO TABS
1.0000 mg | ORAL_TABLET | Freq: Four times a day (QID) | ORAL | Status: DC | PRN
  Administered 2017-08-01: 1 mg via ORAL
  Filled 2017-08-01: qty 1

## 2017-08-01 MED ORDER — METOPROLOL TARTRATE 5 MG/5ML IV SOLN
5.0000 mg | Freq: Once | INTRAVENOUS | Status: AC
  Administered 2017-08-01: 5 mg via INTRAVENOUS
  Filled 2017-08-01: qty 5

## 2017-08-01 MED ORDER — LORAZEPAM 2 MG/ML IJ SOLN
2.0000 mg | Freq: Once | INTRAMUSCULAR | Status: AC
  Administered 2017-08-01: 2 mg via INTRAVENOUS
  Filled 2017-08-01: qty 1

## 2017-08-01 MED ORDER — DILTIAZEM HCL ER COATED BEADS 180 MG PO CP24
360.0000 mg | ORAL_CAPSULE | Freq: Every day | ORAL | Status: DC
  Administered 2017-08-01 – 2017-08-02 (×2): 360 mg via ORAL
  Filled 2017-08-01: qty 2

## 2017-08-01 MED ORDER — ONDANSETRON HCL 4 MG PO TABS
4.0000 mg | ORAL_TABLET | Freq: Four times a day (QID) | ORAL | Status: DC | PRN
  Administered 2017-08-02: 4 mg via ORAL
  Filled 2017-08-01: qty 1

## 2017-08-01 MED ORDER — VITAMIN B-1 100 MG PO TABS
100.0000 mg | ORAL_TABLET | Freq: Every day | ORAL | Status: DC
  Filled 2017-08-01: qty 1

## 2017-08-01 MED ORDER — LORAZEPAM 2 MG/ML IJ SOLN
1.0000 mg | Freq: Once | INTRAMUSCULAR | Status: AC
  Administered 2017-08-01: 1 mg via INTRAVENOUS
  Filled 2017-08-01: qty 1

## 2017-08-01 MED ORDER — HYDROCHLOROTHIAZIDE 25 MG PO TABS
25.0000 mg | ORAL_TABLET | Freq: Every day | ORAL | Status: DC
  Filled 2017-08-01: qty 1

## 2017-08-01 MED ORDER — LORAZEPAM 2 MG/ML IJ SOLN
0.0000 mg | Freq: Two times a day (BID) | INTRAMUSCULAR | Status: DC
Start: 2017-08-03 — End: 2017-08-01

## 2017-08-01 NOTE — ED Notes (Signed)
Called AC about getting Diltiazem (Cardizem CD) 24 hour capsule 360 mg. AC said she would bring the medication to the ED asap.

## 2017-08-01 NOTE — H&P (Signed)
History and Physical    William Franklin UJW:[id number] DOB: [birth year] DOA: 07/31/2017  Referring MD/NP/PA: Glynn Octave, EDP PCP: System, Pcp Not In  Patient coming from: Home  Chief Complaint: Alcohol intoxication, suicidal intent  HPI: William Franklin is a 61 y.o. male with history significant for atrial fibrillation not chronically anticoagulated who presents to the hospital today because he needs help with his alcohol intake.  He makes his own moonshine at home and has been on an alcohol binge since last Friday.  He estimates he has taken at least a gallon and 3/4-2 gallons of moonshine in this timeframe.  He has suffered a fall at home with bruising of his left shoulder and right eye.  He states he has been drinking moonshine because he is depressed and suicidal.  In the emergency department he was found to be in A. fib with RVR with rates as high as 150.  Was given IV Lopressor and 360 mg of oral Cardizem and with this his heart rate is currently in the 60s.  Admission has been requested for alcohol detox.  Past Medical/Surgical History: Past Medical History:  Diagnosis Date  . Atrial fibrillation (HCC)   . Chronic back pain   . COPD (chronic obstructive pulmonary disease) (HCC)   . Hypercholesteremia   . Hypertension   . Rib fracture 03/24/2013  . Sciatica     Past Surgical History:  Procedure Laterality Date  . HERNIA REPAIR      Social History:  reports that he has been smoking Cigarettes.  He has a 30.00 pack-year smoking history. He has never used smokeless tobacco. He reports that he drinks alcohol. He reports that he uses drugs, including Marijuana, about 7 times per week.  Allergies: No Known Allergies  Family History:  Patient is not aware of any family history of significance  Prior to Admission medications   Medication Sig Start Date End Date Taking? Authorizing Provider  albuterol (PROAIR HFA) 108 (90 Base) MCG/ACT inhaler Inhale 1-2 puffs into the lungs  every 6 (six) hours as needed for wheezing or shortness of breath.   Yes [provider]  amoxicillin (AMOXIL) 500 MG capsule Take 500 mg by mouth 3 (three) times daily. 10 day course starting on 07/16/2017   Yes [provider]  aspirin EC 325 MG tablet Take 325 mg by mouth daily.   Yes [provider]  atorvastatin (LIPITOR) 80 MG tablet Take 40 mg by mouth at bedtime.   Yes [provider]  buPROPion (ZYBAN) 150 MG 12 hr tablet Take 150 mg by mouth 2 (two) times daily. For smoking   Yes [provider]  cyclobenzaprine (FLEXERIL) 10 MG tablet Take 1 tablet (10 mg total) by mouth 3 (three) times daily as needed. 07/06/17  Yes Triplett, Tammy, PA-C  diclofenac (VOLTAREN) 50 MG EC tablet Take 50 mg by mouth 2 (two) times daily.   Yes [provider]  diltiazem (CARDIZEM CD) 360 MG 24 hr capsule Take 360 mg by mouth daily.   Yes [provider]  flunisolide (NASAREL) 29 MCG/ACT (0.025%) nasal spray Place 2 sprays into the nose daily.    Yes [provider]  hydrochlorothiazide (HYDRODIURIL) 25 MG tablet Take 25 mg by mouth daily.   Yes [provider]  lisinopril (PRINIVIL,ZESTRIL) 40 MG tablet 40 mg. Take 40 mg by mouth daily.   Yes [provider]  Magnesium Salicylate Tetrahyd (DOANS EXTRA STRENGTH) 580 MG TABS Take 1-2 tablets by mouth daily as  needed.   Yes [provider]  predniSONE (DELTASONE) 10 MG tablet Take 6 tablets day one, 5 tablets day two, 4 tablets day three, 3 tablets day four, 2 tablets day five, then 1 tablet day six 07/06/17  Yes Triplett, Tammy, PA-C  risperiDONE (RISPERDAL) 1 MG tablet Take 1 mg by mouth at bedtime.   Yes [provider]  tacrolimus (PROTOPIC) 0.03 % ointment Apply 1 application topically 2 (two) times daily.   Yes [provider]  tamsulosin (FLOMAX) 0.4 MG CAPS capsule Take 0.4 mg by mouth daily.   Yes [provider]  traZODone  (DESYREL) 150 MG tablet Take 300 mg by mouth at bedtime.   Yes [provider]  leflunomide (ARAVA) 10 MG tablet Take 5 mg by mouth daily.  03/05/13 03/05/14  [provider]  Multiple Vitamin (MULTIVITAMIN WITH MINERALS) TABS Take 1 tablet by mouth daily.    [provider]    Review of Systems:  Constitutional: Denies fever, chills, diaphoresis, appetite change and fatigue.  HEENT: Denies photophobia, eye pain, redness, hearing loss, ear pain, congestion, sore throat, rhinorrhea, sneezing, mouth sores, trouble swallowing, neck pain, neck stiffness and tinnitus.   Respiratory: Denies SOB, DOE, cough, chest tightness,  and wheezing.   Cardiovascular: Denies chest pain, palpitations and leg swelling.  Gastrointestinal: Denies nausea, vomiting, abdominal pain, diarrhea, constipation, blood in stool and abdominal distention.  Genitourinary: Denies dysuria, urgency, frequency, hematuria, flank pain and difficulty urinating.  Endocrine: Denies: hot or cold intolerance, sweats, changes in hair or nails, polyuria, polydipsia. Musculoskeletal: Denies myalgias, back pain, joint swelling, arthralgias and gait problem.  Skin: Denies pallor, rash and wound.  Neurological: Denies dizziness, seizures, syncope,  numbness and headaches.  Hematological: Denies adenopathy. Easy bruising, personal or family bleeding history  Psychiatric/Behavioral: Denies suicidal ideation, mood changes, confusion, nervousness, sleep disturbance and agitation    Physical Exam: Vitals:   08/01/17 0635 08/01/17 0700 08/01/17 0845 08/01/17 1025  BP: (!) 172/110 (!) 131/94 (!) 161/89 (!) 149/108  Pulse: 76 75 (!) 117 (!) 56  Resp: 15 (!) 21 15 18   Temp:   98.7 F (37.1 C) 98.2 F (36.8 C)  TempSrc:   Oral Oral  SpO2: 97% 93% 99% 92%  Weight:      Height:         Constitutional: NAD, calm, comfortable Eyes: PERRL, lids and conjunctivae normal bruising and scabbing of right eye following  fall ENMT: Mucous membranes are  dry. Posterior pharynx clear of any exudate or lesions.poor dentition.  Neck: normal, supple, no masses, no thyromegaly Respiratory: clear to auscultation bilaterally, no wheezing, no crackles. Normal respiratory effort. No accessory muscle use.  Cardiovascular: Regular rate and rhythm, no murmurs / rubs / gallops. No extremity edema. 2+ pedal pulses. No carotid bruits.  Abdomen: no tenderness, no masses palpated. No hepatosplenomegaly. Bowel sounds positive.  Musculoskeletal: no clubbing / cyanosis. No joint deformity upper and lower extremities. Good ROM, no contractures. Normal muscle tone.  Skin: no rashes, lesions, ulcers. No induration Neurologic: CN 2-12 grossly intact. Sensation intact, DTR normal. Strength 5/5 in all 4.  Psychiatric: Normal judgment and insight. Alert and oriented x 3. Normal mood.    Labs on Admission: I have personally reviewed the following labs and imaging studies  CBC:  Recent Labs Lab 07/31/17 2250  WBC 12.5*  NEUTROABS 7.4  HGB 17.6*  HCT 49.8  MCV 96.3  PLT 262   Basic Metabolic Panel:  Recent Labs Lab 07/31/17 2250  NA 133*  K 3.6  CL 98*  CO2 20*  GLUCOSE 92  BUN 15  CREATININE 0.89  CALCIUM 8.1*   GFR: Estimated Creatinine Clearance: 112.8 mL/min (by C-G formula based on SCr of 0.89 mg/dL). Liver Function Tests:  Recent Labs Lab 07/31/17 2250  AST 24  ALT 25  ALKPHOS 110  BILITOT 0.6  PROT 7.3  ALBUMIN 4.0   No results for input(s): LIPASE, AMYLASE in the last 168 hours. No results for input(s): AMMONIA in the last 168 hours. Coagulation Profile: No results for input(s): INR, PROTIME in the last 168 hours. Cardiac Enzymes: No results for input(s): CKTOTAL, CKMB, CKMBINDEX, TROPONINI in the last 168 hours. BNP (last 3 results) No results for input(s): PROBNP in the last 8760 hours. HbA1C: No results for input(s): HGBA1C in the last 72 hours. CBG: No results for input(s): GLUCAP in the  last 168 hours. Lipid Profile: No results for input(s): CHOL, HDL, LDLCALC, TRIG, CHOLHDL, LDLDIRECT in the last 72 hours. Thyroid Function Tests: No results for input(s): TSH, T4TOTAL, FREET4, T3FREE, THYROIDAB in the last 72 hours. Anemia Panel: No results for input(s): VITAMINB12, FOLATE, FERRITIN, TIBC, IRON, RETICCTPCT in the last 72 hours. Urine analysis:    Component Value Date/Time   COLORURINE YELLOW 09/15/2010 0426   APPEARANCEUR CLEAR 09/15/2010 0426   LABSPEC 1.015 09/15/2010 0426   PHURINE 6.0 09/15/2010 0426   GLUCOSEU NEGATIVE 09/15/2010 0426   HGBUR SMALL (A) 09/15/2010 0426   BILIRUBINUR NEGATIVE 09/15/2010 0426   KETONESUR NEGATIVE 09/15/2010 0426   PROTEINUR NEGATIVE 09/15/2010 0426   UROBILINOGEN 0.2 09/15/2010 0426   NITRITE NEGATIVE 09/15/2010 0426   LEUKOCYTESUR NEGATIVE 09/15/2010 0426   Sepsis Labs: @LABRCNTIP (procalcitonin:4,lacticidven:4) )No results found for this or any previous visit (from the past 240 hour(s)).   Radiological Exams on Admission: Dg Chest Portable 1 View  Result Date: 08/01/2017 CLINICAL DATA:  Shortness of breath. History of COPD, current smoker, atrial fibrillation. EXAM: PORTABLE CHEST 1 VIEW COMPARISON:  Chest x-ray and chest CT scan of January 27, 2017 FINDINGS: The lungs are well-expanded. The interstitial markings are coarse though stable. The heart and pulmonary vascularity are normal. There is no pleural effusion. The bony thorax is unremarkable. IMPRESSION: COPD. No pneumonia, CHF, nor other acute cardiopulmonary abnormality. Electronically Signed   By: David  SwazilandJordan M.D.   On: 08/01/2017 08:41    EKG: Independently reviewed.  Atrial fibrillation at a rate of 140, right bundle branch block, no acute ischemic changes  Assessment/Plan Principal Problem:   Atrial fibrillation with RVR (HCC) Active Problems:   Alcohol intoxication (HCC)   Leukocytosis   Suicidal ideation    Atrial fibrillation with rapid ventricular  response -Now rate controlled in the 60s after receiving IV Lopressor and Cardizem 360 mg in the ED. -We will continue Cardizem CD 360 mg daily. -Not on chronic anticoagulation, suspect due to alcoholism and risk for frequent falls.  Acute alcohol intoxication -EtOH level was 260 on admission. -We will plan on detoxing on a CIWA protocol with IV Ativan. -Multivitamin, thiamine, folate have been ordered. -We will request social work assistance to see if patient is willing to undergo alcoholic rehab following discharge, otherwise to provide with outpatient alcohol resources.  Suicidal ideation -Request psychiatry consultation.  Leukocytosis -Mild, suspect reactive, no evident source of infection.   DVT prophylaxis: Lovenox Code Status: Full code Family Communication: Patient only Disposition Plan: Pending medical detox and psychiatry assessment Consults called: Psychiatry Admission status: Inpatient   Time Spent: 8875  minutes  Chaya Jan MD [facility] Pager [phone]  If 7PM-7AM, please contact night-coverage [email or web address] Password [email or web address]  08/01/2017, 10:38 AM

## 2017-08-01 NOTE — ED Provider Notes (Signed)
Patient observed throughout overnight shift.  He remains in atrial fibrillation with RVR rates between 110 and 140.  Patient given IV fluids and his home p.o. Cardizem as well as IV Lopressor.  No apparent change in heart rate.  Patient admits that he is missed his medications for several days due to his alcohol binge.  He is alert and oriented and states he is no longer feeling suicidal or homicidal.  Patient is not medically cleared for TTS and will need further management of his rapid A. fib.  Will start Cardizem drip.  D/w Dr. Ardyth HarpsHernandez who prefers PO meds with PRN IV lopressor over cardizem gtt. She will place admission orders.     Glynn Octaveancour, Tenita Cue, MD 08/01/17 [phone]

## 2017-08-01 NOTE — BH Assessment (Addendum)
Tele Assessment Note   Patient Name: William Franklin MRN: [id number] Referring Physician: Ardyth Harps Location of Patient: [facility]  Location of Provider: Behavioral Health TTS Department  William Franklin is an 61 y.o. male who came [facility] voluntarily last night with alcohol intoxication and suicidal/homicidal ideations. Pt states he has been drinking for three days. Pt came in via EMS with an ETOH of 0.24..n. Pt stated when he came in that "He could shoot someone right now". Upon assessment pt denies SI, HI or AVH. He states "that was the alcohol talking". Pt has a long standing history of alcohol addiction and drank over a gallon of "moonshine" this week. He states that he drinks to cope with his stress. 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 He stopped working this past May and currently has no income. Pt is an Investment banker, operational and served for 13 years. He currently goes to the [place] for medication and therapy for depression and alcoholism. He denies any history of trauma but this is suspected with his long service oversees. Pt is guarded with limited insight. Pt has been to psychaitric inpatient for depression and rehab several times in the past. He is not interested in treatment today. He can contract for safety and guns have been removed from his house by his wife.   Collateral from wife: She states that the pt goes through drinking binges when he can't cope with stress and says "things he doesn't mean". She states that he has never attempted suicide in the past. She has removed the guns from the house. She does not believe he needs inpatient treatment and will help him get an appointment at the [facility] for medication management and therapy.  Psych cleared per Dr. Lucianne Muss. Pt does not need inpatient treatment.  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 Atrial fibrillation (HCC)   . Chronic back pain   . COPD (chronic obstructive pulmonary disease) (HCC)   . Hypercholesteremia   . Hypertension   . Rib fracture 03/24/2013  . Sciatica     Past Surgical History:  Procedure Laterality Date  . HERNIA REPAIR      Family History: History reviewed. No pertinent family history.  Social History:  reports that he has been smoking Cigarettes.  He has a 30.00 pack-year smoking history. He has never used smokeless tobacco. He reports that he drinks alcohol. He reports that he uses drugs, including Marijuana, about 7 times per week.  Additional Social History:  Alcohol / Drug Use History of alcohol / drug use?: Yes Longest period of sobriety (when/how long): Sporatic sobriety Negative Consequences of Use: Work / Programmer, multimedia, Copywriter, advertising relationships, Surveyor, quantity Substance #1 Name of Substance 1: Alcohol 1 - Age of First Use: 13 1 - Amount (size/oz): over a gallon in past week 1 - Frequency: daily 1 - Duration: unspecified 1 - Last Use / Amount: gallon of moonshine in a week   CIWA: CIWA-Ar BP: (!) 149/108 Pulse Rate: (!) 56 Nausea and Vomiting: mild nausea with no vomiting Tactile Disturbances: none Tremor: two Auditory Disturbances: not present Paroxysmal Sweats: two Visual Disturbances: very mild sensitivity Anxiety: three Headache, Fullness in Head: mild Agitation: normal activity Orientation and Clouding of  Sensorium: cannot do serial additions or is uncertain about date CIWA-Ar Total: 12 COWS:    PATIENT STRENGTHS: (choose at least two) Average or above average intelligence General fund of knowledge  Allergies: No Known Allergies  Home Medications:  Medications Prior to Admission  Medication Sig Dispense Refill  . albuterol (PROAIR HFA) 108 (90 Base) MCG/ACT inhaler Inhale 1-2 puffs into the lungs every 6 (six) hours as  needed for wheezing or shortness of breath.    Marland Kitchen amoxicillin (AMOXIL) 500 MG capsule Take 500 mg by mouth 3 (three) times daily. 10 day course starting on 07/16/2017    . aspirin EC 325 MG tablet Take 325 mg by mouth daily.    Marland Kitchen atorvastatin (LIPITOR) 80 MG tablet Take 40 mg by mouth at bedtime.    Marland Kitchen buPROPion (ZYBAN) 150 MG 12 hr tablet Take 150 mg by mouth 2 (two) times daily. For smoking    . cyclobenzaprine (FLEXERIL) 10 MG tablet Take 1 tablet (10 mg total) by mouth 3 (three) times daily as needed. 21 tablet 0  . diclofenac (VOLTAREN) 50 MG EC tablet Take 50 mg by mouth 2 (two) times daily.    Marland Kitchen diltiazem (CARDIZEM CD) 360 MG 24 hr capsule Take 360 mg by mouth daily.    . flunisolide (NASAREL) 29 MCG/ACT (0.025%) nasal spray Place 2 sprays into the nose daily.     . hydrochlorothiazide (HYDRODIURIL) 25 MG tablet Take 25 mg by mouth daily.    Marland Kitchen lisinopril (PRINIVIL,ZESTRIL) 40 MG tablet 40 mg. Take 40 mg by mouth daily.    . Magnesium Salicylate Tetrahyd (DOANS EXTRA STRENGTH) 580 MG TABS Take 1-2 tablets by mouth daily as needed.    . predniSONE (DELTASONE) 10 MG tablet Take 6 tablets day one, 5 tablets day two, 4 tablets day three, 3 tablets day four, 2 tablets day five, then 1 tablet day six 21 tablet 0  . risperiDONE (RISPERDAL) 1 MG tablet Take 1 mg by mouth at bedtime.    . tacrolimus (PROTOPIC) 0.03 % ointment Apply 1 application topically 2 (two) times daily.    . tamsulosin (FLOMAX) 0.4 MG CAPS capsule Take 0.4 mg by mouth daily.    . traZODone (DESYREL) 150 MG tablet Take 300 mg by mouth at bedtime.    Marland Kitchen leflunomide (ARAVA) 10 MG tablet Take 5 mg by mouth daily.     . Multiple Vitamin (MULTIVITAMIN WITH MINERALS) TABS Take 1 tablet by mouth daily.      OB/GYN Status:  No LMP for male patient.  General Assessment Data Location of Assessment: [facility] TTS Assessment: In system Is this a Tele or Face-to-Face Assessment?: Tele Assessment Is this an Initial Assessment or a  Re-assessment for this encounter?: Initial Assessment Marital status: Married Is patient pregnant?: No Pregnancy Status: No Living Arrangements: Spouse/significant other Can pt return to current living arrangement?: No Admission Status: Voluntary Is patient capable of signing voluntary admission?: Yes Referral Source: Self/Family/Friend Insurance type: self pay     Crisis Care Plan Living Arrangements: Spouse/significant other Name of Psychiatrist: [place] [facility]  Education Status Is patient currently in school?: No Current Grade: NA Highest grade of school patient has completed: high school  Risk to self with the past 6 months Suicidal Ideation: No-Not Currently/Within Last 6 Months Has patient been a risk to self within the past 6 months prior to admission? : Yes Suicidal Intent: No Has patient had any suicidal intent within the past 6 months prior to  admission? : No Is patient at risk for suicide?: No Suicidal Plan?: No Has patient had any suicidal plan within the past 6 months prior to admission? : No Access to Means: No What has been your use of drugs/alcohol within the last 12 months?: chronic alcoholic  Previous Attempts/Gestures: No How many times?: 0 Other Self Harm Risks: Alcoholism Triggers for Past Attempts: None known Intentional Self Injurious Behavior: None Family Suicide History: No Recent stressful life event(s): Job Loss, Recent negative physical changes Persecutory voices/beliefs?: No Depression: Yes Depression Symptoms: Isolating, Loss of interest in usual pleasures, Feeling worthless/self pity, Despondent, Feeling angry/irritable Substance abuse history and/or treatment for substance abuse?: Yes Suicide prevention information given to non-admitted patients: Not applicable  Risk to Others within the past 6 months Homicidal Ideation: No Does patient have any lifetime risk of violence toward others beyond the six months prior to admission? :  No Thoughts of Harm to Others: No Current Homicidal Intent: No Current Homicidal Plan: No Access to Homicidal Means: No Identified Victim: none History of harm to others?: No Assessment of Violence: None Noted Violent Behavior Description: no Does patient have access to weapons?: No Criminal Charges Pending?: No Does patient have a court date: No Is patient on probation?: No  Psychosis Hallucinations: None noted Delusions: None noted  Mental Status Report Appearance/Hygiene: Disheveled Eye Contact: Fair Motor Activity: Freedom of movement Speech: Logical/coherent Level of Consciousness: Alert Mood: Depressed Affect: Appropriate to circumstance Anxiety Level: Moderate Thought Processes: Coherent Judgement: Impaired Orientation: Person, Place, Time, Situation Obsessive Compulsive Thoughts/Behaviors: None  Cognitive Functioning Concentration: Normal Memory: Recent Intact, Remote Intact IQ: Average Insight: Poor Impulse Control: Poor Appetite: Fair Weight Loss: 0 Weight Gain: 0 Sleep: Decreased Total Hours of Sleep: 6 Vegetative Symptoms: None  ADLScreening [facility] Assessment Services) Patient's cognitive ability adequate to safely complete daily activities?: Yes Patient able to express need for assistance with ADLs?: Yes Independently performs ADLs?: Yes (appropriate for developmental age)  Prior Inpatient Therapy Prior Inpatient Therapy: Yes Prior Therapy Dates: multi Prior Therapy Facilty/Provider(s): multi Reason for Treatment: Alcoholism  Prior Outpatient Therapy Prior Outpatient Therapy: Yes Prior Therapy Dates: multi [facility] Reason for Treatment: Alcoholism/Depression Does patient have an ACCT team?: No Does patient have Intensive In-House Services?  : No Does patient have [facility] services? : No Does patient have P4CC services?: No  ADL Screening (condition at time of admission) Patient's cognitive ability adequate to  safely complete daily activities?: Yes Is the patient deaf or have difficulty hearing?: No Does the patient have difficulty seeing, even when wearing glasses/contacts?: No Does the patient have difficulty concentrating, remembering, or making decisions?: No Patient able to express need for assistance with ADLs?: Yes Does the patient have difficulty dressing or bathing?: No Independently performs ADLs?: Yes (appropriate for developmental age) Does the patient have difficulty walking or climbing stairs?: No Weakness of Legs: None Weakness of Arms/Hands: None  Home Assistive Devices/Equipment Home Assistive Devices/Equipment: None  Therapy Consults (therapy consults require a physician order) PT Evaluation Needed: No OT Evalulation Needed: No SLP Evaluation Needed: No Abuse/Neglect Assessment (Assessment to be complete while patient is alone) Physical Abuse: Denies Verbal Abuse: Denies Sexual Abuse: Denies Exploitation of patient/patient's resources: Denies Self-Neglect: Denies Values / Beliefs Cultural Requests During Hospitalization: None Spiritual Requests During Hospitalization: None Consults Spiritual Care Consult Needed: No Social Work Consult Needed: No Merchant navy officer (For Healthcare) Does Patient Have a Medical Advance Directive?: No Would patient like information on creating a medical advance directive?: No -  Patient declined    Additional Information 1:1 In Past 12 Months?: No CIRT Risk: No Elopement Risk: No Does patient have medical clearance?: Yes     Disposition:  Disposition Initial Assessment Completed for this Encounter: Yes Disposition of Patient: Outpatient treatment [facility] of outpatient treatment:  ([facility] )  This service was provided via telemedicine using a 2-way, interactive audio and video technology.  Names of all persons participating in this telemedicine service and their role in this encounter. Name: [facility], [place] Role: TTS    Name: Reuel Boomaniel Hoen Role: Patient           Jarrett AblesKristin M Arelene Moroni [facility], LCAS  08/01/2017 12:25 PM

## 2017-08-02 DIAGNOSIS — I4891 Unspecified atrial fibrillation: Secondary | ICD-10-CM | POA: Diagnosis not present

## 2017-08-02 LAB — BASIC METABOLIC PANEL
ANION GAP: 8 (ref 5–15)
BUN: 10 mg/dL (ref 6–20)
CO2: 27 mmol/L (ref 22–32)
Calcium: 8.3 mg/dL — ABNORMAL LOW (ref 8.9–10.3)
Chloride: 102 mmol/L (ref 101–111)
Creatinine, Ser: 0.84 mg/dL (ref 0.61–1.24)
GLUCOSE: 95 mg/dL (ref 65–99)
POTASSIUM: 3.4 mmol/L — AB (ref 3.5–5.1)
Sodium: 137 mmol/L (ref 135–145)

## 2017-08-02 LAB — CBC
HEMATOCRIT: 43.7 % (ref 39.0–52.0)
HEMOGLOBIN: 14.9 g/dL (ref 13.0–17.0)
MCH: 32.7 pg (ref 26.0–34.0)
MCHC: 34.1 g/dL (ref 30.0–36.0)
MCV: 96 fL (ref 78.0–100.0)
Platelets: 185 10*3/uL (ref 150–400)
RBC: 4.55 MIL/uL (ref 4.22–5.81)
RDW: 13.4 % (ref 11.5–15.5)
WBC: 8.5 10*3/uL (ref 4.0–10.5)

## 2017-08-02 LAB — HIV ANTIBODY (ROUTINE TESTING W REFLEX): HIV SCREEN 4TH GENERATION: NONREACTIVE

## 2017-08-02 MED ORDER — FOLIC ACID 1 MG PO TABS: 1.0000 mg | ORAL_TABLET | Freq: Every day | ORAL | Status: AC

## 2017-08-02 MED ORDER — THIAMINE HCL 100 MG PO TABS: 100.0000 mg | ORAL_TABLET | Freq: Every day | ORAL | Status: AC

## 2017-08-02 MED ORDER — DILTIAZEM HCL ER COATED BEADS 360 MG PO CP24: 360.0000 mg | ORAL_CAPSULE | Freq: Every day | ORAL | 2 refills | Status: AC

## 2017-08-02 NOTE — Progress Notes (Signed)
PAtient discharged home with personal belongings, meds from pharmacy, prescriptions and IV removed with site intact.

## 2017-08-02 NOTE — Care Management Note (Signed)
Case Management Note  Patient Details  Name: William MoutonDaniel Franklin MRN: [id number] Date of Birth: 01-02-1956  Subjective/Objective:      Admitted from home with a-fib. Pt has no insurance and no PCP listed. From home, ind with ADL's.               Action/Plan: Medications on $4 list. CM unable to visit pt prior to DC. RN to provide list of provider options for uninsured [facility] [place] [facility] with DC material.   Expected Discharge Date:  08/02/17               Expected Discharge Plan:  Home/Self Care  In-House Referral:  NA  Discharge planning Services  CM Consult  Post Acute Care Choice:  NA Choice offered to:  NA  Status of Service:  Completed, signed off  Malcolm MetroChildress, Wendelin Reader Demske, RN 08/02/2017, 12:39 PM

## 2017-08-02 NOTE — Discharge Summary (Signed)
Physician Discharge Summary  [name]:[id number] DOB: September 03, 1956 DOA: 07/31/2017  PCP: System, Pcp Not In  Admit date: 07/31/2017 Discharge date: 08/02/2017  Time spent: 45 minutes  Recommendations for Outpatient Follow-up:  -Will be discharged home today. -Social worker has given him outpatient alcohol resources.  Discharge Diagnoses:  Principal Problem:   Atrial fibrillation with RVR (HCC) Active Problems:   Alcohol intoxication (HCC)   Leukocytosis   Suicidal ideation   Discharge Condition: Stable and improved  Filed Weights   07/31/17 2043 08/01/17 0930  Weight: 108.9 kg (240 lb) 108.6 kg (239 lb 6.7 oz)    History of present illness:  William Franklin is a 61 y.o. male with history significant for atrial fibrillation not chronically anticoagulated who presents to the hospital today because he needs help with his alcohol intake.  He makes his own moonshine at home and has been on an alcohol binge since last Friday.  He estimates he has taken at least a gallon and 3/4-2 gallons of moonshine in this timeframe.  He has suffered a fall at home with bruising of his left shoulder and right eye.  He states he has been drinking moonshine because he is depressed and suicidal.  In the emergency department he was found to be in A. fib with RVR with rates as high as 150.  Was given IV Lopressor and 360 mg of oral Cardizem and with this his heart rate is currently in the 60s.  Admission has been requested for alcohol detox.  Hospital Course:   Atrial fibrillation with rapid ventricular response -Rate was already controlled by the time he arrived to the floor. -Continue Cardizem CD 260 mg daily. -Not on chronic anticoagulation due to alcoholism and risk for frequent falls.  Acute alcohol intoxication -EtOH level was 260 on admission. -Has not exhibited signs of withdrawal hospitalized. -Continue multivitamin, thiamine, folate. -Social work to provide with alcohol resources  as an outpatient prior to discharge.  Suicidal ideation -Seen by psychiatry, has been cleared.  Procedures:  None   Consultations:  Psychiatry  Discharge Instructions  Discharge Instructions    Diet - low sodium heart healthy    Complete by:  As directed    Increase activity slowly    Complete by:  As directed      Allergies as of 08/02/2017   No Known Allergies     Medication List    STOP taking these medications   amoxicillin 500 MG capsule Commonly known as:  AMOXIL   cyclobenzaprine 10 MG tablet Commonly known as:  FLEXERIL   diclofenac 50 MG EC tablet Commonly known as:  VOLTAREN   DOANS EXTRA STRENGTH 580 MG Tabs Generic drug:  Magnesium Salicylate Tetrahyd   hydrochlorothiazide 25 MG tablet Commonly known as:  HYDRODIURIL   leflunomide 10 MG tablet Commonly known as:  ARAVA   predniSONE 10 MG tablet Commonly known as:  DELTASONE     TAKE these medications   aspirin EC 325 MG tablet Take 325 mg by mouth daily.   atorvastatin 80 MG tablet Commonly known as:  LIPITOR Take 40 mg by mouth at bedtime.   buPROPion 150 MG 12 hr tablet Commonly known as:  ZYBAN Take 150 mg by mouth 2 (two) times daily. For smoking   diltiazem 360 MG 24 hr capsule Commonly known as:  CARDIZEM CD Take 1 capsule (360 mg total) by mouth daily.   flunisolide 29 MCG/ACT nasal spray Commonly known as:  NASAREL Place 2 sprays  into the nose daily.   folic acid 1 MG tablet Commonly known as:  FOLVITE Take 1 tablet (1 mg total) by mouth daily.   lisinopril 40 MG tablet Commonly known as:  PRINIVIL,ZESTRIL 40 mg. Take 40 mg by mouth daily.   multivitamin with minerals Tabs tablet Take 1 tablet by mouth daily.   PROAIR HFA 108 (90 Base) MCG/ACT inhaler Generic drug:  albuterol Inhale 1-2 puffs into the lungs every 6 (six) hours as needed for wheezing or shortness of breath.   risperiDONE 1 MG tablet Commonly known as:  RISPERDAL Take 1 mg by mouth at bedtime.     tacrolimus 0.03 % ointment Commonly known as:  PROTOPIC Apply 1 application topically 2 (two) times daily.   tamsulosin 0.4 MG Caps capsule Commonly known as:  FLOMAX Take 0.4 mg by mouth daily.   thiamine 100 MG tablet Take 1 tablet (100 mg total) by mouth daily.   traZODone 150 MG tablet Commonly known as:  DESYREL Take 300 mg by mouth at bedtime.      No Known Allergies    The results of significant diagnostics from this hospitalization (including imaging, microbiology, ancillary and laboratory) are listed below for reference.    Significant Diagnostic Studies: Dg Chest Portable 1 View  Result Date: 08/01/2017 CLINICAL DATA:  Shortness of breath. History of COPD, current smoker, atrial fibrillation. EXAM: PORTABLE CHEST 1 VIEW COMPARISON:  Chest x-ray and chest CT scan of January 27, 2017 FINDINGS: The lungs are well-expanded. The interstitial markings are coarse though stable. The heart and pulmonary vascularity are normal. There is no pleural effusion. The bony thorax is unremarkable. IMPRESSION: COPD. No pneumonia, CHF, nor other acute cardiopulmonary abnormality. Electronically Signed   By: David  SwazilandJordan M.D.   On: 08/01/2017 08:41    Microbiology: No results found for this or any previous visit (from the past 240 hour(s)).   Labs: Basic Metabolic Panel:  Recent Labs Lab 07/31/17 2250 08/01/17 1249 08/02/17 0533  NA 133*  --  137  K 3.6  --  3.4*  CL 98*  --  102  CO2 20*  --  27  GLUCOSE 92  --  95  BUN 15  --  10  CREATININE 0.89 1.07 0.84  CALCIUM 8.1*  --  8.3*   Liver Function Tests:  Recent Labs Lab 07/31/17 2250  AST 24  ALT 25  ALKPHOS 110  BILITOT 0.6  PROT 7.3  ALBUMIN 4.0   No results for input(s): LIPASE, AMYLASE in the last 168 hours. No results for input(s): AMMONIA in the last 168 hours. CBC:  Recent Labs Lab 07/31/17 2250 08/01/17 1249 08/02/17 0533  WBC 12.5* 11.0* 8.5  NEUTROABS 7.4  --   --   HGB 17.6* 16.4 14.9  HCT  49.8 47.0 43.7  MCV 96.3 95.1 96.0  PLT 262 251 185   Cardiac Enzymes: No results for input(s): CKTOTAL, CKMB, CKMBINDEX, TROPONINI in the last 168 hours. BNP: BNP (last 3 results) No results for input(s): BNP in the last 8760 hours.  ProBNP (last 3 results) No results for input(s): PROBNP in the last 8760 hours.  CBG: No results for input(s): GLUCAP in the last 168 hours.     SignedChaya Jan:  HERNANDEZ ACOSTA,Anjelina Dung  [facility] Pager: [phone] 08/02/2017, 1:40 PM

## 2017-10-04 ENCOUNTER — Encounter (HOSPITAL_COMMUNITY): Payer: Self-pay | Admitting: Emergency Medicine

## 2017-10-04 ENCOUNTER — Emergency Department (HOSPITAL_COMMUNITY): Payer: Non-veteran care

## 2017-10-04 ENCOUNTER — Emergency Department (HOSPITAL_COMMUNITY)
Admission: EM | Admit: 2017-10-04 | Discharge: 2017-10-04 | Disposition: A | Discharge status: Home / Self Care | Payer: Non-veteran care | Attending: Emergency Medicine | Admitting: Emergency Medicine

## 2017-10-04 ENCOUNTER — Other Ambulatory Visit: Payer: Self-pay

## 2017-10-04 DIAGNOSIS — Z5321 Procedure and treatment not carried out due to patient leaving prior to being seen by health care provider: Secondary | ICD-10-CM | POA: Diagnosis not present

## 2017-10-04 DIAGNOSIS — M25571 Pain in right ankle and joints of right foot: Secondary | ICD-10-CM | POA: Insufficient documentation

## 2017-10-04 NOTE — ED Notes (Signed)
Per registration, this pt has left.

## 2017-10-04 NOTE — ED Triage Notes (Signed)
Pt reports right ankle pain ever since fall on Friday. [facility] Pt reports was called and notified of tibia fracture and needed cast and to come to ED.

## 2017-10-05 ENCOUNTER — Emergency Department (HOSPITAL_COMMUNITY)
Admission: EM | Admit: 2017-10-05 | Discharge: 2017-10-05 | Disposition: A | Discharge status: Home / Self Care | Payer: Non-veteran care | Attending: Emergency Medicine | Admitting: Emergency Medicine

## 2017-10-05 ENCOUNTER — Encounter (HOSPITAL_COMMUNITY): Payer: Self-pay

## 2017-10-05 ENCOUNTER — Other Ambulatory Visit: Payer: Self-pay

## 2017-10-05 DIAGNOSIS — Y939 Activity, unspecified: Secondary | ICD-10-CM | POA: Diagnosis not present

## 2017-10-05 DIAGNOSIS — I10 Essential (primary) hypertension: Secondary | ICD-10-CM | POA: Insufficient documentation

## 2017-10-05 DIAGNOSIS — F1210 Cannabis abuse, uncomplicated: Secondary | ICD-10-CM | POA: Diagnosis not present

## 2017-10-05 DIAGNOSIS — Y998 Other external cause status: Secondary | ICD-10-CM | POA: Diagnosis not present

## 2017-10-05 DIAGNOSIS — F17210 Nicotine dependence, cigarettes, uncomplicated: Secondary | ICD-10-CM | POA: Diagnosis not present

## 2017-10-05 DIAGNOSIS — Z79899 Other long term (current) drug therapy: Secondary | ICD-10-CM | POA: Diagnosis not present

## 2017-10-05 DIAGNOSIS — W19XXXA Unspecified fall, initial encounter: Secondary | ICD-10-CM | POA: Insufficient documentation

## 2017-10-05 DIAGNOSIS — Z7982 Long term (current) use of aspirin: Secondary | ICD-10-CM | POA: Insufficient documentation

## 2017-10-05 DIAGNOSIS — J449 Chronic obstructive pulmonary disease, unspecified: Secondary | ICD-10-CM | POA: Insufficient documentation

## 2017-10-05 DIAGNOSIS — S82391A Other fracture of lower end of right tibia, initial encounter for closed fracture: Secondary | ICD-10-CM | POA: Insufficient documentation

## 2017-10-05 DIAGNOSIS — Y92010 Kitchen of single-family (private) house as the place of occurrence of the external cause: Secondary | ICD-10-CM | POA: Diagnosis not present

## 2017-10-05 DIAGNOSIS — S99911A Unspecified injury of right ankle, initial encounter: Secondary | ICD-10-CM | POA: Diagnosis present

## 2017-10-05 MED ORDER — HYDROCODONE-ACETAMINOPHEN 5-325 MG PO TABS: 1.0000 | ORAL_TABLET | ORAL | 0 refills | Status: DC | PRN

## 2017-10-05 MED ORDER — ONDANSETRON HCL 4 MG PO TABS
4.0000 mg | ORAL_TABLET | Freq: Once | ORAL | Status: AC
  Administered 2017-10-05: 4 mg via ORAL
  Filled 2017-10-05: qty 1

## 2017-10-05 MED ORDER — MELOXICAM 15 MG PO TABS: 15.0000 mg | ORAL_TABLET | Freq: Every day | ORAL | 0 refills | Status: AC

## 2017-10-05 MED ORDER — HYDROCODONE-ACETAMINOPHEN 5-325 MG PO TABS
2.0000 | ORAL_TABLET | Freq: Once | ORAL | Status: AC
  Administered 2017-10-05: 2 via ORAL
  Filled 2017-10-05: qty 2

## 2017-10-05 NOTE — ED Notes (Signed)
Home with friend Mr Tami RibasDoss driving

## 2017-10-05 NOTE — ED Notes (Signed)
HB in to assess 

## 2017-10-05 NOTE — ED Notes (Signed)
Pt here last night and left due to prolonged wait time [facility] [place] [facility] distal [facility]  Called by clinic and told to go to an ED and get a boot for fx

## 2017-10-05 NOTE — Discharge Instructions (Signed)
Your x-ray reveals a fracture of the tibial area.  Please keep your ankle elevated above your waist when sitting and above your heart when lying down.  Use meloxicam 1 daily with food.  Use Norco every 4 hours as needed for pain.  [facility]

## 2017-10-05 NOTE — ED Provider Notes (Signed)
[facility] EMERGENCY DEPARTMENT Provider Note   CSN: [id number] Arrival date & time: 10/05/17  1914     History   Chief Complaint Chief Complaint  Patient presents with  . Leg Pain    HPI William Franklin is a 62 y.o. male.  Patient is a 62 year old male who presents to the emergency department with a complaint of right ankle pain.  The patient states that approximately a week ago he fell in his kitchen.  He noticed immediate pain of his right ankle, but with ice and conservative measures the pain got a little better.  On Wednesday, January 2 the pain began to be more intense with throbbing.  He then noticed swelling of his right ankle.  He was seen by the [facility].  They did an x-ray, but did not give him a call until recently that he had a fracture.  It is also noted the patient came to the emergency department on last night, he noticed an extended wait time and left the emergency department.  He returns today to obtain a boot for his ankle fracture as instructed by the [facility].  Patient denies being on any anticoagulation medications.  No previous operations or procedures involving the right ankle.      Past Medical History:  Diagnosis Date  . Atrial fibrillation (HCC)   . Chronic back pain   . COPD (chronic obstructive pulmonary disease) (HCC)   . Hypercholesteremia   . Hypertension   . Rib fracture 03/24/2013  . Sciatica     Patient Active Problem List   Diagnosis Date Noted  . Atrial fibrillation with RVR (HCC) 08/01/2017  . Alcohol intoxication (HCC) 08/01/2017  . Leukocytosis 08/01/2017  . Suicidal ideation 08/01/2017    Past Surgical History:  Procedure Laterality Date  . HERNIA REPAIR         Home Medications    Prior to Admission medications   Medication Sig Start Date End Date Taking? Authorizing Provider  albuterol (PROAIR HFA) 108 (90 Base) MCG/ACT inhaler Inhale 1-2 puffs into the lungs every 6  (six) hours as needed for wheezing or shortness of breath.    [provider]  aspirin EC 325 MG tablet Take 325 mg by mouth daily.    [provider]  atorvastatin (LIPITOR) 80 MG tablet Take 40 mg by mouth at bedtime.    [provider]  buPROPion (ZYBAN) 150 MG 12 hr tablet Take 150 mg by mouth 2 (two) times daily. For smoking    [provider]  diltiazem (CARDIZEM CD) 360 MG 24 hr capsule Take 1 capsule (360 mg total) by mouth daily. 08/02/17   Philip Aspen, Limmie Patricia, MD  flunisolide (NASAREL) 29 MCG/ACT (0.025%) nasal spray Place 2 sprays into the nose daily.     [provider]  folic acid (FOLVITE) 1 MG tablet Take 1 tablet (1 mg total) by mouth daily. 08/03/17   Philip Aspen, Limmie Patricia, MD  lisinopril (PRINIVIL,ZESTRIL) 40 MG tablet 40 mg. Take 40 mg by mouth daily.    [provider]  Multiple Vitamin (MULTIVITAMIN WITH MINERALS) TABS Take 1 tablet by mouth daily.    [provider]  risperiDONE (RISPERDAL) 1 MG tablet Take 1 mg by mouth at bedtime.    [provider]  tacrolimus (PROTOPIC) 0.03 % ointment Apply 1 application topically 2 (two) times daily.    [provider]  tamsulosin (FLOMAX) 0.4 MG CAPS capsule Take 0.4 mg by mouth daily.  [provider]  thiamine 100 MG tablet Take 1 tablet (100 mg total) by mouth daily. 08/03/17   Philip AspenHernandez Acosta, Limmie PatriciaEstela Y, MD  traZODone (DESYREL) 150 MG tablet Take 300 mg by mouth at bedtime.    [provider]    Family History No family history on file.  Social History Social History   Tobacco Use  . Smoking status: Current Every Day Smoker    Packs/day: 1.00    Years: 30.00    Pack years: 30.00    Types: Cigarettes  . Smokeless tobacco: Never Used  Substance Use Topics  . Alcohol use: Yes    Comment: half a gallon of moonshine   . Drug use: Yes    Frequency: 7.0 times per week    Types: Marijuana     Allergies   Patient  has no known allergies.   Review of Systems Review of Systems  Constitutional: Negative for activity change.       All ROS Neg except as noted in HPI  HENT: Negative for nosebleeds.   Eyes: Negative for photophobia and discharge.  Respiratory: Positive for cough. Negative for shortness of breath and wheezing.   Cardiovascular: Negative for chest pain and palpitations.  Gastrointestinal: Negative for abdominal pain and blood in stool.  Genitourinary: Negative for dysuria, frequency and hematuria.  Musculoskeletal: Positive for arthralgias. Negative for back pain and neck pain.  Skin: Negative.   Neurological: Negative for dizziness, seizures and speech difficulty.  Psychiatric/Behavioral: Negative for confusion and hallucinations.     Physical Exam Updated Vital Signs BP 117/89 (BP Location: Right Arm)   Pulse 96   Temp 98.8 F (37.1 C) (Oral)   Resp 18   Ht 6\' 2"  (1.88 m)   Wt 108.9 kg (240 lb)   SpO2 98%   BMI 30.81 kg/m   Physical Exam  Constitutional: He is oriented to person, place, and time. He appears well-developed and well-nourished.  Non-toxic appearance.  HENT:  Head: Normocephalic.  Right Ear: Tympanic membrane and external ear normal.  Left Ear: Tympanic membrane and external ear normal.  Eyes: EOM and lids are normal. Pupils are equal, round, and reactive to light.  Neck: Normal range of motion. Neck supple. Carotid bruit is not present.  Cardiovascular: Normal rate, regular rhythm, normal heart sounds, intact distal pulses and normal pulses.  Pulmonary/Chest: Breath sounds normal. No respiratory distress.  Few scattered rhonchi present.  Symmetrical rise and fall of the chest.  Abdominal: Soft. Bowel sounds are normal. There is no tenderness. There is no guarding.  Musculoskeletal: Normal range of motion.  There is good range of motion of the right knee.  There is swelling of the right ankle.  The Achilles tendon is intact.  There is tenderness of the  posterior malleolus area.  Also the dorsum of the ankle area.  Capillary refill is less than 2 seconds.  The dorsalis pedis pulses 2+.  There is good range of motion of the left lower extremity without problem.  Lymphadenopathy:       Head (right side): No submandibular adenopathy present.       Head (left side): No submandibular adenopathy present.    He has no cervical adenopathy.  Neurological: He is alert and oriented to person, place, and time. He has normal strength. No cranial nerve deficit or sensory deficit.  Skin: Skin is warm and dry.  Psychiatric: He has a normal mood and affect. His speech is normal.  Nursing note and vitals reviewed.  ED Treatments / Results  Labs (all labs ordered are listed, but only abnormal results are displayed) Labs Reviewed - No data to display  EKG  EKG Interpretation None       Radiology Dg Tibia/fibula Right  Result Date: 10/04/2017 CLINICAL DATA:  Patient states he was having a-fib and fell and twisted his right ankle, went to [facility] hospital and was told he has an ankle fracture. States pain to that area and difficulty walking. EXAM: RIGHT TIBIA AND FIBULA - 2 VIEW COMPARISON:  None. FINDINGS: Coronal fracture of the posterior malleolus of the distal right tibia with mild displacement of the fracture fragment. No definite fractures identified in the medial and lateral malleolus. Ankle views may be more sensitive for evaluation of additional ankle injuries. The proximal tibia and fibula are otherwise intact. Mild degenerative changes in the right knee and intertarsal joints. Vascular calcifications and calcified phleboliths in the soft tissues. IMPRESSION: Coronal minimally displaced fracture of the posterior malleolus of the distal right tibia. Electronically Signed   By: Burman Nieves M.D.   On: 10/04/2017 18:38    Procedures Procedures (including critical care time) FRACTURE CARE. Patient sustained a fall nearly a week ago with injury to  the right ankle.Cathlean Sauer reveals a fracture of the posterior malleolus at the distal tibial area.  I discussed the fracture with the patient in terms which he understands.  I discussed the immobilization process.  The patient gives permission for the procedure.  Pt ID by armband. Timeout taken. Pt fitted with cam walker by nursing staff. Pt ambulated in the hall with walker without problem. Rx for  Mobic and Norco given to the patient. Pt tolerated procedure witout problem.   Medications Ordered in ED Medications  HYDROcodone-acetaminophen (NORCO/VICODIN) 5-325 MG per tablet 2 tablet (not administered)  ondansetron (ZOFRAN) tablet 4 mg (not administered)     Initial Impression / Assessment and Plan / ED Course  I have reviewed the triage vital signs and the nursing notes.  Pertinent labs & imaging results that were available during my care of the patient were reviewed by me and considered in my medical decision making (see chart for details).       Final Clinical Impressions(s) / ED Diagnoses MDM Patient sustained a fall nearly a week ago and injured the ankle.  He had an x-ray done at the [facility] and was later advised that he had a fracture.  The patient also had a x-ray here at the emergency department on yesterday which revealed a coronal minimally displaced fracture of the posterior malleolus.  The patient was fitted with a Cam walker.  Patient states he has crutches at home.Rx for norco given to the patient for pain.   Final diagnoses:  Closed fracture of posterior malleolus of right tibia, initial encounter    ED Discharge Orders        Ordered    HYDROcodone-acetaminophen (NORCO/VICODIN) 5-325 MG tablet  Every 4 hours PRN     10/05/17 1215    meloxicam (MOBIC) 15 MG tablet  Daily     10/05/17 1215       Ivery Quale, PA-C 10/05/17 1215    Ivery Quale, PA-C 10/05/17 1223    Donnetta Hutching, MD 10/06/17 2231

## 2017-10-05 NOTE — ED Notes (Signed)
DC delayed due to pt awaiting driver that he may take narcotic pain meds prior to discharge

## 2017-10-05 NOTE — ED Triage Notes (Signed)
Patient was here last night for same complaint and left. Complains of right lower leg pain after fall. [facility] and was told he had [facility] in right lower leg.

## 2017-11-27 ENCOUNTER — Emergency Department (HOSPITAL_COMMUNITY)
Admission: EM | Admit: 2017-11-27 | Discharge: 2017-11-27 | Disposition: A | Discharge status: Home / Self Care | Payer: Non-veteran care | Attending: Emergency Medicine | Admitting: Emergency Medicine

## 2017-11-27 ENCOUNTER — Encounter (HOSPITAL_COMMUNITY): Payer: Self-pay | Admitting: Emergency Medicine

## 2017-11-27 ENCOUNTER — Emergency Department (HOSPITAL_COMMUNITY): Payer: Non-veteran care

## 2017-11-27 ENCOUNTER — Other Ambulatory Visit: Payer: Self-pay

## 2017-11-27 DIAGNOSIS — Z7982 Long term (current) use of aspirin: Secondary | ICD-10-CM | POA: Insufficient documentation

## 2017-11-27 DIAGNOSIS — I10 Essential (primary) hypertension: Secondary | ICD-10-CM | POA: Diagnosis not present

## 2017-11-27 DIAGNOSIS — J449 Chronic obstructive pulmonary disease, unspecified: Secondary | ICD-10-CM | POA: Insufficient documentation

## 2017-11-27 DIAGNOSIS — F17210 Nicotine dependence, cigarettes, uncomplicated: Secondary | ICD-10-CM | POA: Diagnosis not present

## 2017-11-27 DIAGNOSIS — M25571 Pain in right ankle and joints of right foot: Secondary | ICD-10-CM | POA: Insufficient documentation

## 2017-11-27 DIAGNOSIS — Z79899 Other long term (current) drug therapy: Secondary | ICD-10-CM | POA: Diagnosis not present

## 2017-11-27 MED ORDER — KETOROLAC TROMETHAMINE 30 MG/ML IJ SOLN
30.0000 mg | Freq: Once | INTRAMUSCULAR | Status: AC
  Administered 2017-11-27: 30 mg via INTRAMUSCULAR

## 2017-11-27 MED ORDER — HYDROCODONE-ACETAMINOPHEN 5-325 MG PO TABS
1.0000 | ORAL_TABLET | Freq: Once | ORAL | Status: AC
  Administered 2017-11-27: 1 via ORAL
  Filled 2017-11-27: qty 1

## 2017-11-27 MED ORDER — KETOROLAC TROMETHAMINE 30 MG/ML IJ SOLN
30.0000 mg | Freq: Once | INTRAMUSCULAR | Status: DC
Start: 2017-11-27 — End: 2017-11-27
  Filled 2017-11-27: qty 1

## 2017-11-27 MED ORDER — HYDROCODONE-ACETAMINOPHEN 5-325 MG PO TABS: 1.0000 | ORAL_TABLET | ORAL | 0 refills | Status: DC | PRN

## 2017-11-27 NOTE — Discharge Instructions (Signed)
As discussed, it does not appear todays fall caused a re injury to your ankle and the old injury is still healing.  Continue to wear your cam walker boot. Ice and elevation for the next few days can help with this new pain.  You may take the hydrocodone prescribed for pain relief.  This will make you drowsy - do not drive within 4 hours of taking this medication.

## 2017-11-27 NOTE — ED Provider Notes (Signed)
[facility] EMERGENCY DEPARTMENT Provider Note   CSN: [id number] Arrival date & time: 11/27/17  1518     History   Chief Complaint Chief Complaint  Patient presents with  . Fall    HPI William Franklin is a 62 y.o. male who is currently healing from a right tibial lateral malleolus fracture from an injury incurred on January 5 presenting for reinjury of the same area after slipping in mother this morning, describing an inversion injury to the joint despite the fact he was actually wearing a Cam walker when this occurred.  He has yet to establish care with orthopedics for the initial injury.  [facility] and reports his first orthopedic visit is scheduled for March 5, stating he missed the initial visit so had to reschedule.  He has had no pain medicine since his injury occurred as he was away from home and unable to do anything to treat the new injury until this evening.  There is no radiation of pain and he denies any other injury, specifically he did not hit his head, denies neck or any new back pain.  The history is provided by the patient.    Past Medical History:  Diagnosis Date  . Atrial fibrillation (HCC)   . Chronic back pain   . COPD (chronic obstructive pulmonary disease) (HCC)   . Hypercholesteremia   . Hypertension   . Rib fracture 03/24/2013  . Sciatica     Patient Active Problem List   Diagnosis Date Noted  . Atrial fibrillation with RVR (HCC) 08/01/2017  . Alcohol intoxication (HCC) 08/01/2017  . Leukocytosis 08/01/2017  . Suicidal ideation 08/01/2017    Past Surgical History:  Procedure Laterality Date  . HERNIA REPAIR         Home Medications    Prior to Admission medications   Medication Sig Start Date End Date Taking? Authorizing Provider  albuterol (PROAIR HFA) 108 (90 Base) MCG/ACT inhaler Inhale 1-2 puffs into the lungs every 6 (six) hours as needed for wheezing or shortness of breath.    [provider]  aspirin EC 325 MG  tablet Take 325 mg by mouth daily.    [provider]  atorvastatin (LIPITOR) 80 MG tablet Take 40 mg by mouth at bedtime.    [provider]  buPROPion (ZYBAN) 150 MG 12 hr tablet Take 150 mg by mouth 2 (two) times daily. For smoking    [provider]  diltiazem (CARDIZEM CD) 360 MG 24 hr capsule Take 1 capsule (360 mg total) by mouth daily. 08/02/17   Philip AspenHernandez Acosta, Limmie PatriciaEstela Y, MD  flunisolide (NASAREL) 29 MCG/ACT (0.025%) nasal spray Place 2 sprays into the nose daily.     [provider]  folic acid (FOLVITE) 1 MG tablet Take 1 tablet (1 mg total) by mouth daily. 08/03/17   Philip AspenHernandez Acosta, Limmie PatriciaEstela Y, MD  HYDROcodone-acetaminophen (NORCO/VICODIN) 5-325 MG tablet Take 1 tablet by mouth every 4 (four) hours as needed. 11/27/17   Burgess AmorIdol, Quantia Grullon, PA-C  lisinopril (PRINIVIL,ZESTRIL) 40 MG tablet 40 mg. Take 40 mg by mouth daily.    [provider]  meloxicam (MOBIC) 15 MG tablet Take 1 tablet (15 mg total) by mouth daily. Please take this medication with food 10/05/17   Ivery QualeBryant, Hobson, PA-C  Multiple Vitamin (MULTIVITAMIN WITH MINERALS) TABS Take 1 tablet by mouth daily.    [provider]  risperiDONE (RISPERDAL) 1 MG tablet Take 1 mg by mouth at bedtime.  [provider]  tacrolimus (PROTOPIC) 0.03 % ointment Apply 1 application topically 2 (two) times daily.    [provider]  tamsulosin (FLOMAX) 0.4 MG CAPS capsule Take 0.4 mg by mouth daily.    [provider]  thiamine 100 MG tablet Take 1 tablet (100 mg total) by mouth daily. 08/03/17   Philip Aspen, Limmie Patricia, MD  traZODone (DESYREL) 150 MG tablet Take 300 mg by mouth at bedtime.    [provider]    Family History History reviewed. No pertinent family history.  Social History Social History   Tobacco Use  . Smoking status: Current Every Day Smoker    Packs/day: 1.00    Years: 30.00    Pack years: 30.00    Types: Cigarettes  . Smokeless  tobacco: Never Used  Substance Use Topics  . Alcohol use: Yes    Comment: half a gallon of moonshine   . Drug use: Yes    Frequency: 7.0 times per week    Types: Marijuana     Allergies   Patient has no known allergies.   Review of Systems Review of Systems  Constitutional: Negative for fever.  Musculoskeletal: Positive for arthralgias. Negative for joint swelling and myalgias.  Neurological: Negative for weakness and numbness.     Physical Exam Updated Vital Signs BP 138/86 (BP Location: Left Arm)   Pulse 61   Temp 98 F (36.7 C) (Oral)   Resp 18   Ht 6\' 2"  (1.88 m)   Wt 108.9 kg (240 lb)   SpO2 98%   BMI 30.81 kg/m   Physical Exam  Constitutional: He appears well-developed and well-nourished.  HENT:  Head: Normocephalic.  Cardiovascular: Normal rate and intact distal pulses. Exam reveals no decreased pulses.  Pulses:      Dorsalis pedis pulses are 2+ on the right side, and 2+ on the left side.       Posterior tibial pulses are 2+ on the right side, and 2+ on the left side.  Musculoskeletal: He exhibits tenderness. He exhibits no edema.       Right ankle: He exhibits decreased range of motion. He exhibits no swelling, no ecchymosis and normal pulse. Tenderness. Lateral malleolus tenderness found. No head of 5th metatarsal and no proximal fibula tenderness found. Achilles tendon normal.  Neurological: He is alert. No sensory deficit.  Skin: Skin is warm, dry and intact.  Nursing note and vitals reviewed.    ED Treatments / Results  Labs (all labs ordered are listed, but only abnormal results are displayed) Labs Reviewed - No data to display  EKG  EKG Interpretation None       Radiology Dg Ankle Complete Right  Result Date: 11/27/2017 CLINICAL DATA:  Diffuse right ankle pain after slipping and falling today. EXAM: RIGHT ANKLE - COMPLETE 3+ VIEW COMPARISON:  Right lower leg dated 10/04/2017. FINDINGS: Lateral soft tissue swelling. The previously  demonstrated posterior malleolus fracture has partially healed with associated callus formation. Minimal proximal displacement of the posterior fragment. No acute fracture or dislocation seen. Calcaneal spurs are again demonstrated. IMPRESSION: Healing posterior malleolus fracture with no acute fracture seen. Electronically Signed   By: Beckie Salts M.D.   On: 11/27/2017 15:52    Procedures Procedures (including critical care time)  Medications Ordered in ED Medications  HYDROcodone-acetaminophen (NORCO/VICODIN) 5-325 MG per tablet 1 tablet (1 tablet Oral Given 11/27/17 1755)  ketorolac (TORADOL) 30 MG/ML injection 30 mg (30 mg Intramuscular Given 11/27/17 1754)  Initial Impression / Assessment and Plan / ED Course  I have reviewed the triage vital signs and the nursing notes.  Pertinent labs & imaging results that were available during my care of the patient were reviewed by me and considered in my medical decision making (see chart for details).     Imaging reviewed and discussed with patient.  There is no new injury and his healing bimalleolar fracture is evident on today's imaging.  He was prescribed a small quantity of hydrocodone, he is already on Mobic daily.  Ice and elevation were discussed, continuing use of his cam walker.  Plan to see ortho at the [facility] next week as he has scheduled.  Final Clinical Impressions(s) / ED Diagnoses   Final diagnoses:  Acute right ankle pain    ED Discharge Orders        Ordered    HYDROcodone-acetaminophen (NORCO/VICODIN) 5-325 MG tablet  Every 4 hours PRN     11/27/17 1736       Burgess Amor, PA-C 11/27/17 2016    Vanetta Mulders, MD 11/28/17 [phone]

## 2017-11-27 NOTE — ED Triage Notes (Signed)
Pt slipped in mud today hurting right foot. Pt wearing cam walker boot at time of fall. Pt was seen on January 5th and diagnosed with right malleolus of right tibia. Pt has not followed up with orthopedic for continued care.

## 2017-11-27 NOTE — ED Triage Notes (Signed)
Patient called for Triage, no answer. 

## 2018-02-06 ENCOUNTER — Encounter (HOSPITAL_COMMUNITY): Payer: Self-pay | Admitting: Emergency Medicine

## 2018-02-06 ENCOUNTER — Other Ambulatory Visit: Payer: Self-pay

## 2018-02-06 ENCOUNTER — Emergency Department (HOSPITAL_COMMUNITY)
Admission: EM | Admit: 2018-02-06 | Discharge: 2018-02-06 | Disposition: A | Discharge status: Home / Self Care | Payer: Non-veteran care | Attending: Emergency Medicine | Admitting: Emergency Medicine

## 2018-02-06 DIAGNOSIS — G8929 Other chronic pain: Secondary | ICD-10-CM | POA: Diagnosis not present

## 2018-02-06 DIAGNOSIS — R45850 Homicidal ideations: Secondary | ICD-10-CM | POA: Insufficient documentation

## 2018-02-06 DIAGNOSIS — Y907 Blood alcohol level of 200-239 mg/100 ml: Secondary | ICD-10-CM | POA: Diagnosis not present

## 2018-02-06 DIAGNOSIS — Z7982 Long term (current) use of aspirin: Secondary | ICD-10-CM | POA: Insufficient documentation

## 2018-02-06 DIAGNOSIS — F10129 Alcohol abuse with intoxication, unspecified: Secondary | ICD-10-CM | POA: Diagnosis not present

## 2018-02-06 DIAGNOSIS — I4891 Unspecified atrial fibrillation: Secondary | ICD-10-CM | POA: Diagnosis not present

## 2018-02-06 DIAGNOSIS — M545 Low back pain: Secondary | ICD-10-CM | POA: Diagnosis not present

## 2018-02-06 DIAGNOSIS — I10 Essential (primary) hypertension: Secondary | ICD-10-CM | POA: Insufficient documentation

## 2018-02-06 DIAGNOSIS — J449 Chronic obstructive pulmonary disease, unspecified: Secondary | ICD-10-CM | POA: Insufficient documentation

## 2018-02-06 DIAGNOSIS — M549 Dorsalgia, unspecified: Secondary | ICD-10-CM

## 2018-02-06 DIAGNOSIS — F17210 Nicotine dependence, cigarettes, uncomplicated: Secondary | ICD-10-CM | POA: Diagnosis not present

## 2018-02-06 DIAGNOSIS — F1010 Alcohol abuse, uncomplicated: Secondary | ICD-10-CM

## 2018-02-06 DIAGNOSIS — F332 Major depressive disorder, recurrent severe without psychotic features: Secondary | ICD-10-CM | POA: Diagnosis not present

## 2018-02-06 DIAGNOSIS — Z79899 Other long term (current) drug therapy: Secondary | ICD-10-CM | POA: Insufficient documentation

## 2018-02-06 LAB — COMPREHENSIVE METABOLIC PANEL
ALBUMIN: 3.8 g/dL (ref 3.5–5.0)
ALK PHOS: 86 U/L (ref 38–126)
ALT: 27 U/L (ref 17–63)
AST: 23 U/L (ref 15–41)
Anion gap: 10 (ref 5–15)
BILIRUBIN TOTAL: 0.8 mg/dL (ref 0.3–1.2)
BUN: 9 mg/dL (ref 6–20)
CALCIUM: 8.6 mg/dL — AB (ref 8.9–10.3)
CO2: 22 mmol/L (ref 22–32)
CREATININE: 0.86 mg/dL (ref 0.61–1.24)
Chloride: 106 mmol/L (ref 101–111)
GFR calc Af Amer: >60 mL/min (ref >60)
GLUCOSE: 102 mg/dL — AB (ref 65–99)
Potassium: 3.1 mmol/L — ABNORMAL LOW (ref 3.5–5.1)
Sodium: 138 mmol/L (ref 135–145)
TOTAL PROTEIN: 6.9 g/dL (ref 6.5–8.1)

## 2018-02-06 LAB — CBC WITH DIFFERENTIAL/PLATELET
Basophils Absolute: 0.1 10*3/uL (ref 0.0–0.1)
Basophils Relative: 1 %
EOS PCT: 6 %
Eosinophils Absolute: 0.7 10*3/uL (ref 0.0–0.7)
HEMATOCRIT: 50.8 % (ref 39.0–52.0)
HEMOGLOBIN: 17.7 g/dL — AB (ref 13.0–17.0)
LYMPHS PCT: 30 %
Lymphs Abs: 3.6 10*3/uL (ref 0.7–4.0)
MCH: 32.7 pg (ref 26.0–34.0)
MCHC: 34.8 g/dL (ref 30.0–36.0)
MCV: 93.7 fL (ref 78.0–100.0)
Monocytes Absolute: 1 10*3/uL (ref 0.1–1.0)
Monocytes Relative: 9 %
Neutro Abs: 6.4 10*3/uL (ref 1.7–7.7)
Neutrophils Relative %: 54 %
Platelets: 231 10*3/uL (ref 150–400)
RBC: 5.42 MIL/uL (ref 4.22–5.81)
RDW: 13.8 % (ref 11.5–15.5)
WBC: 11.7 10*3/uL — ABNORMAL HIGH (ref 4.0–10.5)

## 2018-02-06 LAB — PROTIME-INR
INR: 1.01
Prothrombin Time: 13.2 seconds (ref 11.4–15.2)

## 2018-02-06 LAB — RAPID URINE DRUG SCREEN, HOSP PERFORMED
AMPHETAMINES: NOT DETECTED
BARBITURATES: NOT DETECTED
BENZODIAZEPINES: POSITIVE — AB
COCAINE: NOT DETECTED
Opiates: NOT DETECTED
TETRAHYDROCANNABINOL: POSITIVE — AB

## 2018-02-06 LAB — ACETAMINOPHEN LEVEL: Acetaminophen (Tylenol), Serum: <10 ug/mL — ABNORMAL LOW (ref 10–30)

## 2018-02-06 LAB — SALICYLATE LEVEL: Salicylate Lvl: <7 mg/dL (ref 2.8–30.0)

## 2018-02-06 LAB — APTT: aPTT: 30 seconds (ref 24–36)

## 2018-02-06 LAB — ETHANOL: Alcohol, Ethyl (B): 237 mg/dL — ABNORMAL HIGH (ref <10)

## 2018-02-06 MED ORDER — FOLIC ACID 1 MG PO TABS
1.0000 mg | ORAL_TABLET | Freq: Every day | ORAL | Status: DC
  Administered 2018-02-06: 1 mg via ORAL
  Filled 2018-02-06: qty 1

## 2018-02-06 MED ORDER — POTASSIUM CHLORIDE CRYS ER 20 MEQ PO TBCR
40.0000 meq | EXTENDED_RELEASE_TABLET | Freq: Once | ORAL | Status: AC
  Administered 2018-02-06: 40 meq via ORAL
  Filled 2018-02-06: qty 2

## 2018-02-06 MED ORDER — HALOPERIDOL 5 MG PO TABS
5.0000 mg | ORAL_TABLET | Freq: Once | ORAL | Status: AC
  Administered 2018-02-06: 5 mg via ORAL
  Filled 2018-02-06: qty 1

## 2018-02-06 MED ORDER — ALBUTEROL SULFATE HFA 108 (90 BASE) MCG/ACT IN AERS: 1.0000 | INHALATION_SPRAY | Freq: Four times a day (QID) | RESPIRATORY_TRACT | Status: DC | PRN

## 2018-02-06 MED ORDER — LORAZEPAM 1 MG PO TABS
1.0000 mg | ORAL_TABLET | ORAL | Status: DC | PRN
  Administered 2018-02-06: 1 mg via ORAL
  Filled 2018-02-06 (×3): qty 1

## 2018-02-06 MED ORDER — LOPERAMIDE HCL 2 MG PO CAPS
4.0000 mg | ORAL_CAPSULE | Freq: Once | ORAL | Status: AC
  Administered 2018-02-06: 4 mg via ORAL
  Filled 2018-02-06: qty 2

## 2018-02-06 MED ORDER — NICOTINE 21 MG/24HR TD PT24
21.0000 mg | MEDICATED_PATCH | Freq: Every day | TRANSDERMAL | Status: DC
  Administered 2018-02-06: 21 mg via TRANSDERMAL
  Filled 2018-02-06: qty 1

## 2018-02-06 MED ORDER — LORAZEPAM 1 MG PO TABS
2.0000 mg | ORAL_TABLET | Freq: Once | ORAL | Status: AC
  Administered 2018-02-06: 2 mg via ORAL

## 2018-02-06 MED ORDER — VITAMIN B-1 100 MG PO TABS
100.0000 mg | ORAL_TABLET | Freq: Every day | ORAL | Status: DC
  Administered 2018-02-06: 100 mg via ORAL
  Filled 2018-02-06: qty 1

## 2018-02-06 MED ORDER — ONDANSETRON HCL 4 MG PO TABS: 4.0000 mg | ORAL_TABLET | Freq: Three times a day (TID) | ORAL | Status: DC | PRN

## 2018-02-06 MED ORDER — TRAZODONE HCL 50 MG PO TABS
300.0000 mg | ORAL_TABLET | Freq: Every day | ORAL | Status: DC
  Administered 2018-02-06: 300 mg via ORAL
  Filled 2018-02-06: qty 6

## 2018-02-06 MED ORDER — IBUPROFEN 400 MG PO TABS: 600.0000 mg | ORAL_TABLET | Freq: Three times a day (TID) | ORAL | Status: DC | PRN

## 2018-02-06 MED ORDER — RISPERIDONE 1 MG PO TABS
1.0000 mg | ORAL_TABLET | Freq: Every day | ORAL | Status: DC
  Administered 2018-02-06: 1 mg via ORAL
  Filled 2018-02-06: qty 1

## 2018-02-06 NOTE — Progress Notes (Signed)
Disposition CSW contacted [facility] to notify them that pt was in the [facility] initially dx with Major Depressive Disorder, Recurrent, Moderate Alcohol Use Disorder, Severe Opioid Use Disorder, Severe Benzodiazepine Use Disorder, Moderate, and that he requested to be referred to the [place], [place] [facility].  Spoke with April at the [facility] office and she verified that pt has been receiving treatment at the hospital in [place] and could be referred there.  Contact information for the two transfer coordinators was obtained.  Hospital # is [phone] and [place] [phone]).    At this time it is not clear if the pt will be cleared psychiatrically as we are waiting to get collateral from pt's wife.  It does appear that at a minimum, pt will require SA detox and treatment, in which case we would check bed availability and give the patient contact information.  If patient requires psychiatric treatment, we will request the [place] forms and complete referral process.  Disposition will continue to follow for any required placement needs.  [name]. Kaylyn Lim, MSW, LCSWA Disposition Clinical Social Work [phone] (cell) [phone] (office)

## 2018-02-06 NOTE — ED Notes (Signed)
Pt keeps stating that the "[place] are coming" and that he "had to get in his fox hole".

## 2018-02-06 NOTE — ED Triage Notes (Signed)
Pt admits to drinking "about 12 beers" tonight, states he has PTSD and has been feeling like wanting to kill himself and possibly hurting someone else.  Pt states 3 days ago he overdosed on vicodin that he is prescribed for his back pain
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
## 2018-02-06 NOTE — Consult Note (Signed)
  Tele Assessment   William Franklin, 62 y.o., male patient seen via telepsych by TTS and this provider; chart reviewed and consulted with Dr. Lucianne Muss on 02/06/18.  On evaluation William Franklin reports that he presented to [facility] intoxicated and made statements that he was having suicidal thoughts and also requesting assistance with alcohol and substance abuse.  At this time patient denies suicidal/self-harm/homicidal ideation, psychosis, and paranoia.  Patient states that he has a court date on 02/11/18 for DUI.   During evaluation William Franklin is alert/oriented x 4; calm/cooperative; and mood congruent with affect.  He does not appear to be responding to internal/external stimuli or delusional thoughts.  Patient denies suicidal/self-harm/homicidal ideation, psychosis, and paranoia.  Patient answered question appropriately.  Patient psychiatrically cleared.    For detailed note see TTS Tele Assessment Note  Recommendations:  After court date follow up with referral/resources given for short/long term rehab services.  Give community resources for substance.    Disposition: No evidence of imminent risk to self or others at present.   Patient does not meet criteria for psychiatric inpatient admission.  William Dimalanta B. Roben Schliep, NP

## 2018-02-06 NOTE — ED Notes (Signed)
Cathren Harsh (security) reports that pts wife picked up wallet.

## 2018-02-06 NOTE — ED Notes (Signed)
Pt given breakfast tray
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
## 2018-02-06 NOTE — ED Notes (Signed)
Pt given lunch tray.

## 2018-02-06 NOTE — ED Notes (Signed)
TTS in progress 

## 2018-02-06 NOTE — ED Provider Notes (Addendum)
  Physical Exam  BP 132/79 (BP Location: Left Arm)   Pulse (!) 113   Temp 97.8 F (36.6 C) (Oral)   Resp 20   SpO2 96%   Physical Exam  ED Course/Procedures     Procedures  MDM  Seen by psychiatry and cleared for discharge. [place]       Benjiman Core, MD 02/06/18 1636    Benjiman Core, MD 02/06/18 [phone]

## 2018-06-29 ENCOUNTER — Other Ambulatory Visit: Payer: Self-pay

## 2018-06-29 ENCOUNTER — Emergency Department (HOSPITAL_COMMUNITY)
Admission: EM | Admit: 2018-06-29 | Discharge: 2018-06-29 | Disposition: A | Discharge status: Home / Self Care | Payer: Non-veteran care | Attending: Emergency Medicine | Admitting: Emergency Medicine

## 2018-06-29 ENCOUNTER — Encounter (HOSPITAL_COMMUNITY): Payer: Self-pay | Admitting: Emergency Medicine

## 2018-06-29 DIAGNOSIS — F1110 Opioid abuse, uncomplicated: Secondary | ICD-10-CM | POA: Diagnosis not present

## 2018-06-29 DIAGNOSIS — I10 Essential (primary) hypertension: Secondary | ICD-10-CM | POA: Insufficient documentation

## 2018-06-29 DIAGNOSIS — R45851 Suicidal ideations: Secondary | ICD-10-CM | POA: Diagnosis not present

## 2018-06-29 DIAGNOSIS — Z008 Encounter for other general examination: Secondary | ICD-10-CM | POA: Diagnosis present

## 2018-06-29 DIAGNOSIS — J449 Chronic obstructive pulmonary disease, unspecified: Secondary | ICD-10-CM | POA: Diagnosis not present

## 2018-06-29 DIAGNOSIS — F331 Major depressive disorder, recurrent, moderate: Secondary | ICD-10-CM | POA: Diagnosis not present

## 2018-06-29 DIAGNOSIS — Z7982 Long term (current) use of aspirin: Secondary | ICD-10-CM | POA: Diagnosis not present

## 2018-06-29 DIAGNOSIS — F1020 Alcohol dependence, uncomplicated: Secondary | ICD-10-CM | POA: Insufficient documentation

## 2018-06-29 DIAGNOSIS — Z79899 Other long term (current) drug therapy: Secondary | ICD-10-CM | POA: Diagnosis not present

## 2018-06-29 DIAGNOSIS — F1010 Alcohol abuse, uncomplicated: Secondary | ICD-10-CM

## 2018-06-29 DIAGNOSIS — F17210 Nicotine dependence, cigarettes, uncomplicated: Secondary | ICD-10-CM | POA: Insufficient documentation

## 2018-06-29 LAB — CBC
HEMATOCRIT: 53.1 % — AB (ref 39.0–52.0)
HEMOGLOBIN: 18.9 g/dL — AB (ref 13.0–17.0)
MCH: 34.4 pg — ABNORMAL HIGH (ref 26.0–34.0)
MCHC: 35.6 g/dL (ref 30.0–36.0)
MCV: 96.7 fL (ref 78.0–100.0)
Platelets: 284 10*3/uL (ref 150–400)
RBC: 5.49 MIL/uL (ref 4.22–5.81)
RDW: 14.1 % (ref 11.5–15.5)
WBC: 13.1 10*3/uL — ABNORMAL HIGH (ref 4.0–10.5)

## 2018-06-29 LAB — COMPREHENSIVE METABOLIC PANEL
ALT: 27 U/L (ref 0–44)
AST: 27 U/L (ref 15–41)
Albumin: 4.1 g/dL (ref 3.5–5.0)
Alkaline Phosphatase: 90 U/L (ref 38–126)
Anion gap: 12 (ref 5–15)
BILIRUBIN TOTAL: 0.7 mg/dL (ref 0.3–1.2)
BUN: 8 mg/dL (ref 8–23)
CO2: 22 mmol/L (ref 22–32)
CREATININE: 0.76 mg/dL (ref 0.61–1.24)
Calcium: 8.5 mg/dL — ABNORMAL LOW (ref 8.9–10.3)
Chloride: 100 mmol/L (ref 98–111)
GFR calc Af Amer: >60 mL/min (ref >60)
Glucose, Bld: 108 mg/dL — ABNORMAL HIGH (ref 70–99)
Potassium: 3.9 mmol/L (ref 3.5–5.1)
Sodium: 134 mmol/L — ABNORMAL LOW (ref 135–145)
TOTAL PROTEIN: 7.9 g/dL (ref 6.5–8.1)

## 2018-06-29 LAB — ETHANOL: Alcohol, Ethyl (B): 338 mg/dL (ref <10)

## 2018-06-29 LAB — ACETAMINOPHEN LEVEL

## 2018-06-29 LAB — SALICYLATE LEVEL

## 2018-06-29 LAB — RAPID URINE DRUG SCREEN, HOSP PERFORMED
Amphetamines: NOT DETECTED
Barbiturates: NOT DETECTED
Benzodiazepines: POSITIVE — AB
Cocaine: NOT DETECTED
OPIATES: NOT DETECTED
Tetrahydrocannabinol: NOT DETECTED

## 2018-06-29 MED ORDER — LORAZEPAM 1 MG PO TABS: 1.0000 mg | ORAL_TABLET | Freq: Three times a day (TID) | ORAL | 0 refills | Status: DC | PRN

## 2018-06-29 MED ORDER — ONDANSETRON HCL 4 MG PO TABS: 4.0000 mg | ORAL_TABLET | Freq: Four times a day (QID) | ORAL | 0 refills | Status: DC

## 2018-06-29 MED ORDER — ONDANSETRON HCL 4 MG PO TABS: 4.0000 mg | ORAL_TABLET | Freq: Four times a day (QID) | ORAL | 0 refills | Status: AC

## 2018-06-29 NOTE — Discharge Instructions (Addendum)
Medication for withdrawal and nausea.  RN will give you information sheet for outpatient programs.
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
## 2018-06-29 NOTE — ED Provider Notes (Signed)
[facility] EMERGENCY DEPARTMENT Provider Note   CSN: [id number] Arrival date & time: 06/29/18  0148  Time seen 02:16 AM   History   Chief Complaint Chief Complaint  Patient presents with  . V70.1   Level 5 caveat for alcohol intoxication  HPI William Franklin is a 62 y.o. male.  HPI when I go in the room and asked patient was going on he states "I need help".  When asking what kind to help he needs he states he has alcohol and drug problems.  When I ask him what drugs he takes he states "everything I can get my hands on".  He states "if you do not help me I do not give a D**m and I will overdose on drugs.  He states he is depressed.  He states "I am about ready to kill myself".  He states "I just assume die".  Patient appears to be very inebriated with slurred speech and he is hard to keep focused on what we are talking about.  PCP System, Pcp Not In   Past Medical History:  Diagnosis Date  . Atrial fibrillation (HCC)   . Chronic back pain   . COPD (chronic obstructive pulmonary disease) (HCC)   . Hypercholesteremia   . Hypertension   . Rib fracture 03/24/2013  . Sciatica     Patient Active Problem List   Diagnosis Date Noted  . Atrial fibrillation with RVR (HCC) 08/01/2017  . Alcohol intoxication (HCC) 08/01/2017  . Leukocytosis 08/01/2017  . Suicidal ideation 08/01/2017    Past Surgical History:  Procedure Laterality Date  . HERNIA REPAIR          Home Medications    Prior to Admission medications   Medication Sig Start Date End Date Taking? Authorizing Provider  albuterol (PROAIR HFA) 108 (90 Base) MCG/ACT inhaler Inhale 1-2 puffs into the lungs every 6 (six) hours as needed for wheezing or shortness of breath.    [provider]  aspirin EC 325 MG tablet Take 325 mg by mouth daily.    [provider]  atorvastatin (LIPITOR) 80 MG tablet Take 40 mg by mouth at bedtime.    [provider]  buPROPion (ZYBAN) 150 MG 12 hr tablet Take  150 mg by mouth 2 (two) times daily. For smoking    [provider]  cyclobenzaprine (FLEXERIL) 10 MG tablet Take 10 mg by mouth 3 (three) times daily as needed for muscle spasms.    [provider]  diclofenac (VOLTAREN) 25 MG EC tablet Take 25 mg by mouth 2 (two) times daily.    [provider]  diltiazem (CARDIZEM CD) 360 MG 24 hr capsule Take 1 capsule (360 mg total) by mouth daily. 08/02/17   Philip Aspen, Limmie Patricia, MD  flunisolide (NASAREL) 29 MCG/ACT (0.025%) nasal spray Place 2 sprays into the nose daily.     [provider]  folic acid (FOLVITE) 1 MG tablet Take 1 tablet (1 mg total) by mouth daily. 08/03/17   Philip Aspen, Limmie Patricia, MD  HYDROcodone-acetaminophen (NORCO/VICODIN) 5-325 MG tablet Take 1 tablet by mouth every 4 (four) hours as needed. 11/27/17   Burgess Amor, PA-C  lisinopril (PRINIVIL,ZESTRIL) 40 MG tablet 40 mg. Take 40 mg by mouth daily.    [provider]  meloxicam (MOBIC) 15 MG tablet Take 1 tablet (15 mg total) by mouth daily. Please take this medication with food 10/05/17   Ivery Quale, PA-C  metoprolol tartrate (LOPRESSOR) 50 MG tablet Take  50 mg by mouth 2 (two) times daily.    [provider]  mirtazapine (REMERON) 15 MG tablet Take 15 mg by mouth at bedtime.    [provider]  Multiple Vitamin (MULTIVITAMIN WITH MINERALS) TABS Take 1 tablet by mouth daily.    [provider]  risperiDONE (RISPERDAL) 1 MG tablet Take 1 mg by mouth at bedtime.    [provider]  tacrolimus (PROTOPIC) 0.03 % ointment Apply 1 application topically 2 (two) times daily.    [provider]  tamsulosin (FLOMAX) 0.4 MG CAPS capsule Take 0.4 mg by mouth daily.    [provider]  thiamine 100 MG tablet Take 1 tablet (100 mg total) by mouth daily. 08/03/17   Philip Aspen, Limmie Patricia, MD  traZODone (DESYREL) 150 MG tablet Take 300 mg by mouth at bedtime.    [provider]     Family History No family history on file.  Social History Social History   Tobacco Use  . Smoking status: Current Every Day Smoker    Packs/day: 1.00    Years: 30.00    Pack years: 30.00    Types: Cigarettes  . Smokeless tobacco: Never Used  Substance Use Topics  . Alcohol use: Yes    Comment: half a gallon of moonshine   . Drug use: Yes    Frequency: 7.0 times per week    Types: Marijuana, Oxycodone  lives at home Lives with spouse   Allergies   Patient has no known allergies.   Review of Systems Review of Systems  Unable to perform ROS: Psychiatric disorder     Physical Exam Updated Vital Signs BP 119/90   Pulse 76   Temp 97.6 F (36.4 C) (Oral)   Resp 16   SpO2 100%   Vital signs normal    Physical Exam  Constitutional: He appears well-developed and well-nourished.  Non-toxic appearance. He does not appear ill. He appears distressed.  Patient is loud, easily agitated, he seems very upset with his male nurse.  HENT:  Head: Normocephalic and atraumatic.  Right Ear: External ear normal.  Left Ear: External ear normal.  Nose: Nose normal. No mucosal edema or rhinorrhea.  Mouth/Throat: Oropharynx is clear and moist and mucous membranes are normal. No dental abscesses or uvula swelling.  Eyes: Pupils are equal, round, and reactive to light. Conjunctivae and EOM are normal.  Neck: Normal range of motion and full passive range of motion without pain. Neck supple.  Cardiovascular: Normal rate, regular rhythm and normal heart sounds. Exam reveals no gallop and no friction rub.  No murmur heard. Pulmonary/Chest: Effort normal and breath sounds normal. No respiratory distress. He has no wheezes. He has no rhonchi. He has no rales. He exhibits no tenderness and no crepitus.  Abdominal: Soft. Normal appearance and bowel sounds are normal. He exhibits no distension. There is no tenderness. There is no rebound and no guarding.  Musculoskeletal: Normal range of  motion. He exhibits no edema or tenderness.  Moves all extremities well.   Neurological: He has normal strength. No cranial nerve deficit.  Patient appears sleepy but he is able to stay awake and communicate.  He is moving all extremities well.  Skin: Skin is warm, dry and intact. No rash noted. No erythema. No pallor.  Psychiatric: His affect is labile. His speech is slurred. He is agitated and slowed. He expresses suicidal ideation. He expresses suicidal plans.  Nursing note and vitals reviewed.    ED Treatments /  Results  Labs (all labs ordered are listed, but only abnormal results are displayed) Results for orders placed or performed during the hospital encounter of 06/29/18  CBC  Result Value Ref Range   WBC 13.1 (H) 4.0 - 10.5 K/uL   RBC 5.49 4.22 - 5.81 MIL/uL   Hemoglobin 18.9 (H) 13.0 - 17.0 g/dL   HCT 09.8 (H) 11.9 - 14.7 %   MCV 96.7 78.0 - 100.0 fL   MCH 34.4 (H) 26.0 - 34.0 pg   MCHC 35.6 30.0 - 36.0 g/dL   RDW 82.9 56.2 - 13.0 %   Platelets 284 150 - 400 K/uL  Ethanol  Result Value Ref Range   Alcohol, Ethyl (B) 338 (HH) <10 mg/dL  Comprehensive metabolic panel  Result Value Ref Range   Sodium 134 (L) 135 - 145 mmol/L   Potassium 3.9 3.5 - 5.1 mmol/L   Chloride 100 98 - 111 mmol/L   CO2 22 22 - 32 mmol/L   Glucose, Bld 108 (H) 70 - 99 mg/dL   BUN 8 8 - 23 mg/dL   Creatinine, Ser 8.65 0.61 - 1.24 mg/dL   Calcium 8.5 (L) 8.9 - 10.3 mg/dL   Total Protein 7.9 6.5 - 8.1 g/dL   Albumin 4.1 3.5 - 5.0 g/dL   AST 27 15 - 41 U/L   ALT 27 0 - 44 U/L   Alkaline Phosphatase 90 38 - 126 U/L   Total Bilirubin 0.7 0.3 - 1.2 mg/dL   GFR calc non Af Amer >60 >60 mL/min   GFR calc Af Amer >60 >60 mL/min   Anion gap 12 5 - 15  Rapid urine drug screen (hospital performed)  Result Value Ref Range   Opiates NONE DETECTED NONE DETECTED   Cocaine NONE DETECTED NONE DETECTED   Benzodiazepines POSITIVE (A) NONE DETECTED   Amphetamines NONE DETECTED NONE DETECTED    Tetrahydrocannabinol NONE DETECTED NONE DETECTED   Barbiturates NONE DETECTED NONE DETECTED  Salicylate level  Result Value Ref Range   Salicylate Lvl <7.0 2.8 - 30.0 mg/dL  Acetaminophen level  Result Value Ref Range   Acetaminophen (Tylenol), Serum <10 (L) 10 - 30 ug/mL   Laboratory interpretation all normal except leukocytosis, positive UDS, alcohol intoxication    EKG None  Radiology No results found.  Procedures Procedures (including critical care time)  Medications Ordered in ED Medications - No data to display   Initial Impression / Assessment and Plan / ED Course  I have reviewed the triage vital signs and the nursing notes.  Pertinent labs & imaging results that were available during my care of the patient were reviewed by me and considered in my medical decision making (see chart for details).     I have talked to patient and explained to him that we were going to getting help however he need to be a little bit more sober first.  He states he will stay.  However patient tries to leave of hard enough that he is intending on going home and hurting himself that IVC papers would need to be filled out.  Patient has a Comptroller.  6:20 AM patient feels like he is able to talk to the mental health counselor now.  TTS consult was ordered.  Review of the [place] shows patient only had 2 controlled substances prescribed since January 2019 both from our ED.  He got #15 hydrocodone 5/325 on January 5 and #20 hydrocodone 5/325 on February 27.  Final Clinical Impressions(s) /  ED Diagnoses   Final diagnoses:  Moderate episode of recurrent major depressive disorder (HCC)  Suicidal ideation  Alcohol abuse    Disposition pending  Devoria Albe, MD, Concha Pyo, MD 06/29/18 [phone]

## 2018-06-29 NOTE — Progress Notes (Signed)
Patient is seen by me via tele-psych and I have consulted with Dr. Lucianne Muss.  Patient denies any suicidal or homicidal ideations.  Patient also denies any hallucinations.  Patient states that he does drink excessively and in the day that he can get ahold of alcohol.  Patient also reports that he would like to go to a rehab facility, but patient also reports that he has been to 8 or 9 rehab facilities in the past.  Patient is informed that we can give him resources for outpatient services and he is in agreement with that.  Patient does not meet inpatient criteria and is psychiatrically cleared.  I have contacted Dr. Adriana Simas and notified him of the recommendations.

## 2018-06-29 NOTE — ED Notes (Signed)
CRITICAL VALUE ALERT  Critical Value: ETOH 338 Date & Time Notied: 06/29/18 @ 0341 Provider Notified: Dr Lars Mage Orders Received/Actions taken: None yet

## 2018-06-29 NOTE — ED Provider Notes (Signed)
No homicidal or suicidal ideation.  No psychosis.  Patient has been cleared by behavioral health.  Discharge medications Ativan 1 mg and Zofran 4 mg.   Donnetta Hutching, MD 06/29/18 1406

## 2018-06-29 NOTE — BH Assessment (Signed)
Tele Assessment Note   Patient Name: William Franklin MRN: [id number] Referring Physician: EDP Location of Patient: [facility] Location of Provider: Behavioral Health TTS Department  William Franklin is a 62 y.o. male who presented to [facility] on voluntary basis with complaint of depressive symptoms and significant substance use.  Pt lives with wife in [place].  He is on disability and receives outpatient psychiatric and therapy services through the [place].  Pt was last assessed by TTS in May 2019.  At that time, Pt reported feeling suicidal and homicidal, as well as substance use.  Pt was discharged.  Pt presented to the ED with BAC of 338 and UDS positive for benzos. On admission, Pt advised hospital staff that he needed help for his alcohol land drug use, and that if he did not get help, he would overdose on drugs.  ''I am about ready to kill myself.''  At the time he made these statements, Pt appeared to be intoxicated when he made these statements.  Pt was assessed this morning by author.  He denied suicidal ideation, homicidal ideation, self-injurious behavior, and hallucination.  Pt reported that he daily ingest as much beer and pills as he can buy.  Pills include street-purchased Xanax and other benzos and opioids.  When asked what kind of help could be provided, Pt was unclear.  ''I don't know.  I just need help.''  Pt endorsed the following symptoms of depression:  Persistent and unremitting despondency; disturbed sleep and disturbed appetite; irritability; feelings of worthlessness; fatigue.  Pt denied abuse.  During assessment, Pt presented as drowsy but able to answer questions.  Pt was in scrubs, and he appeared appropriately groomed.  Pt had fair eye contact..  Demeanor was slightly irritable.  Pt's mood was reported as depressed, and affect was mood-congruent.  Pt endorsed despondency and other depressive symptoms, as well as ongoing substance use.  Pt denied suicidal ideation, homicidal ideation,  hallucination, and self-injurious behavior.  Pt endorsed ongoing use of alcohol, benzos, and opioids.  Pt's speech was normal in rate, rhythm, and volume.  Thought processes were within normal range, and thought content was logical and goal-oriented.  There was no evidence of delusion.  Pt's memory and concentration were fair.  Insight, judgment, and impulse control were fair to poor (poor as evidenced by ongoing substance use).  Consulted with T. Money, NP, who determined that Pt does not meet inpatient criteria as Pt denies SI, HI, AVH.  Pt asked for assistance in detox.  Recommended he contact the [place] or that he contact [facility] in [place] -- [phone].  Diagnosis: F33.1 Major Depressive Disorder, Recurrent, Moderate; F10.20 Alcohol Use Disorder, Severe; Opioid Use Disorder, Severe Past Medical History:  Past Medical History:  Diagnosis Date  . Atrial fibrillation (HCC)   . Chronic back pain   . COPD (chronic obstructive pulmonary disease) (HCC)   . Hypercholesteremia   . Hypertension   . Rib fracture 03/24/2013  . Sciatica     Past Surgical History:  Procedure Laterality Date  . HERNIA REPAIR      Family History: No family history on file.  Social History:  reports that he has been smoking cigarettes. He has a 30.00 pack-year smoking history. He has never used smokeless tobacco. He reports that he drinks alcohol. He reports that he has current or past drug history. Drugs: Marijuana and Oxycodone. Frequency: 7.00 times per week.  Additional Social History:  Alcohol / Drug Use Pain Medications: See MAR Prescriptions: See  MAR Over the Counter: See MAR History of alcohol / drug use?: Yes Substance #1 Name of Substance 1: Alcohol 1 - Age of First Use: 13 1 - Amount (size/oz): up to 18 beers 1 - Frequency: Daily 1 - Duration: Ongoing 1 - Last Use / Amount: 06/28/18 Substance #2 Name of Substance 2: Xanax and other benzos 2 - Amount (size/oz):  Varied -- as much as can be purchased on the street 2 - Frequency: Daily 2 - Duration: Ongoing 2 - Last Use / Amount: 06/28/18 Substance #3 Name of Substance 3: Opioids 3 - Age of First Use: Unknown 3 - Amount (size/oz): Varied -- as much as he can obtain from the street 3 - Frequency: Daily 3 - Duration: Ongoing 3 - Last Use / Amount: Unknown  CIWA: CIWA-Ar BP: 98/68 Pulse Rate: 80 COWS:    Allergies: No Known Allergies  Home Medications:  (Not in a hospital admission)  OB/GYN Status:  No LMP for male patient.  General Assessment Data Location of Assessment: [facility] TTS Assessment: In system Is this a Tele or Face-to-Face Assessment?: Tele Assessment Is this an Initial Assessment or a Re-assessment for this encounter?: Initial Assessment Patient Accompanied by:: N/A Language Other than English: No Living Arrangements: Other (Comment)(Lives with wife in [place]) What gender do you identify as?: Male Marital status: Married [place] name: NA Pregnancy Status: No Living Arrangements: Spouse/significant other Can pt return to current living arrangement?: Yes Admission Status: Voluntary Is patient capable of signing voluntary admission?: Yes Referral Source: Self/Family/Friend Insurance type: [place]     Crisis Care Plan Living Arrangements: Spouse/significant other Legal Guardian: Other:(None) Name of Psychiatrist: [facility] Name of Therapist: [facility]  Education Status Is patient currently in school?: No Is the patient employed, unemployed or receiving disability?: Receiving disability income  Risk to self with the past 6 months Suicidal Ideation: No Has patient been a risk to self within the past 6 months prior to admission? : No Suicidal Intent: No Has patient had any suicidal intent within the past 6 months prior to admission? : No Is patient at risk for suicide?: No Suicidal Plan?: No Has patient had any suicidal plan within the past 6 months prior to admission? :  No Access to Means: No What has been your use of drugs/alcohol within the last 12 months?: Alcohol; Opioids; Benzos Previous Attempts/Gestures: No Intentional Self Injurious Behavior: None Family Suicide History: No Recent stressful life event(s): Financial Problems, Recent negative physical changes Persecutory voices/beliefs?: No Depression: Yes Depression Symptoms: Despondent, Insomnia, Fatigue, Feeling worthless/self pity, Feeling angry/irritable, Loss of interest in usual pleasures Substance abuse history and/or treatment for substance abuse?: Yes Suicide prevention information given to non-admitted patients: Not applicable  Risk to Others within the past 6 months Homicidal Ideation: No-Not Currently/Within Last 6 Months Does patient have any lifetime risk of violence toward others beyond the six months prior to admission? : No Thoughts of Harm to Others: No-Not Currently Present/Within Last 6 Months Current Homicidal Intent: No Current Homicidal Plan: No Access to Homicidal Means: No Identified Victim: NA History of harm to others?: No Assessment of Violence: None Noted Does patient have access to weapons?: No Criminal Charges Pending?: No Does patient have a court date: No Is patient on probation?: Unknown  Psychosis Hallucinations: None noted Delusions: None noted  Mental Status Report Appearance/Hygiene: In scrubs, Unremarkable Eye Contact: Fair Motor Activity: Freedom of movement, Unremarkable Speech: Logical/coherent Level of Consciousness: Drowsy, Irritable Mood: Preoccupied, Depressed Affect: Appropriate to circumstance Anxiety Level:  Minimal Thought Processes: Coherent, Relevant Judgement: Partial Orientation: Person, Place, Time, Situation Obsessive Compulsive Thoughts/Behaviors: None  Cognitive Functioning Concentration: Normal Memory: Recent Intact, Remote Intact Is patient IDD: No Insight: Fair Impulse Control: Poor(per substance use) Appetite:  Fair Have you had any weight changes? : No Change Sleep: Decreased Total Hours of Sleep: 5(Mixed, disturbed sleep) Vegetative Symptoms: None  ADLScreening [facility] Assessment Services) Patient's cognitive ability adequate to safely complete daily activities?: Yes Patient able to express need for assistance with ADLs?: No Independently performs ADLs?: Yes (appropriate for developmental age)  Prior Inpatient Therapy Prior Inpatient Therapy: Yes [facility] [place] Reason for Treatment: MDD, Substance Use  Prior Outpatient Therapy Prior Outpatient Therapy: Yes Prior Therapy Dates: Ongoing [facility] Reason for Treatment: MDD, Substance Use Does patient have an ACCT team?: No Does patient have Intensive In-House Services?  : No Does patient have [facility] services? : No Does patient have P4CC services?: No  ADL Screening (condition at time of admission) Patient's cognitive ability adequate to safely complete daily activities?: Yes Is the patient deaf or have difficulty hearing?: No Does the patient have difficulty seeing, even when wearing glasses/contacts?: No Does the patient have difficulty concentrating, remembering, or making decisions?: Yes Patient able to express need for assistance with ADLs?: No Does the patient have difficulty dressing or bathing?: No Independently performs ADLs?: Yes (appropriate for developmental age) Does the patient have difficulty walking or climbing stairs?: No Weakness of Legs: None Weakness of Arms/Hands: None  Home Assistive Devices/Equipment Home Assistive Devices/Equipment: None  Therapy Consults (therapy consults require a physician order) PT Evaluation Needed: No OT Evalulation Needed: No SLP Evaluation Needed: No Abuse/Neglect Assessment (Assessment to be complete while patient is alone) Abuse/Neglect Assessment Can Be Completed: Yes Physical Abuse:  Denies Verbal Abuse: Denies Sexual Abuse: Denies Exploitation of patient/patient's resources: Denies Self-Neglect: Denies Values / Beliefs Cultural Requests During Hospitalization: None Spiritual Requests During Hospitalization: None Consults Spiritual Care Consult Needed: No Social Work Consult Needed: No Merchant navy officer (For Healthcare) Does Patient Have a Medical Advance Directive?: No          Disposition:  Disposition Initial Assessment Completed for this Encounter: Yes Disposition of Patient: Discharge(Per T. Money, NP, Pt is psych cleared)  This service was provided via telemedicine using a 2-way, interactive audio and Immunologist.  Names of all persons participating in this telemedicine service and their role in this encounter. Name: Purcell Mouton Role: Patient  Name:  Money, T. Role: NP          Earline Mayotte 06/29/2018 11:27 AM

## 2018-07-05 ENCOUNTER — Other Ambulatory Visit: Payer: Self-pay

## 2018-07-05 ENCOUNTER — Encounter (HOSPITAL_COMMUNITY): Payer: Self-pay | Admitting: Emergency Medicine

## 2018-07-05 ENCOUNTER — Emergency Department (HOSPITAL_COMMUNITY)
Admission: EM | Admit: 2018-07-05 | Discharge: 2018-07-06 | Disposition: A | Discharge status: Home / Self Care | Payer: Non-veteran care | Attending: Emergency Medicine | Admitting: Emergency Medicine

## 2018-07-05 DIAGNOSIS — Z79899 Other long term (current) drug therapy: Secondary | ICD-10-CM | POA: Insufficient documentation

## 2018-07-05 DIAGNOSIS — J449 Chronic obstructive pulmonary disease, unspecified: Secondary | ICD-10-CM | POA: Insufficient documentation

## 2018-07-05 DIAGNOSIS — F1994 Other psychoactive substance use, unspecified with psychoactive substance-induced mood disorder: Secondary | ICD-10-CM | POA: Diagnosis not present

## 2018-07-05 DIAGNOSIS — F10920 Alcohol use, unspecified with intoxication, uncomplicated: Secondary | ICD-10-CM | POA: Diagnosis not present

## 2018-07-05 DIAGNOSIS — F1010 Alcohol abuse, uncomplicated: Secondary | ICD-10-CM | POA: Insufficient documentation

## 2018-07-05 DIAGNOSIS — I10 Essential (primary) hypertension: Secondary | ICD-10-CM | POA: Diagnosis not present

## 2018-07-05 DIAGNOSIS — Z7982 Long term (current) use of aspirin: Secondary | ICD-10-CM | POA: Diagnosis not present

## 2018-07-05 DIAGNOSIS — F17210 Nicotine dependence, cigarettes, uncomplicated: Secondary | ICD-10-CM | POA: Diagnosis not present

## 2018-07-05 DIAGNOSIS — F10929 Alcohol use, unspecified with intoxication, unspecified: Secondary | ICD-10-CM | POA: Diagnosis present

## 2018-07-05 HISTORY — DX: Alcohol abuse, uncomplicated: F10.10

## 2018-07-05 LAB — RAPID URINE DRUG SCREEN, HOSP PERFORMED
AMPHETAMINES: NOT DETECTED
Barbiturates: NOT DETECTED
Benzodiazepines: NOT DETECTED
Cocaine: NOT DETECTED
Opiates: NOT DETECTED
Tetrahydrocannabinol: NOT DETECTED

## 2018-07-05 LAB — COMPREHENSIVE METABOLIC PANEL
ALBUMIN: 4.1 g/dL (ref 3.5–5.0)
ALT: 26 U/L (ref 0–44)
ANION GAP: 13 (ref 5–15)
AST: 23 U/L (ref 15–41)
Alkaline Phosphatase: 92 U/L (ref 38–126)
BILIRUBIN TOTAL: 0.6 mg/dL (ref 0.3–1.2)
BUN: 10 mg/dL (ref 8–23)
CALCIUM: 8.2 mg/dL — AB (ref 8.9–10.3)
CO2: 24 mmol/L (ref 22–32)
CREATININE: 0.92 mg/dL (ref 0.61–1.24)
Chloride: 99 mmol/L (ref 98–111)
GFR calc Af Amer: >60 mL/min (ref >60)
Glucose, Bld: 107 mg/dL — ABNORMAL HIGH (ref 70–99)
Potassium: 3.9 mmol/L (ref 3.5–5.1)
Sodium: 136 mmol/L (ref 135–145)
TOTAL PROTEIN: 7.5 g/dL (ref 6.5–8.1)

## 2018-07-05 LAB — CBC
HCT: 53.2 % — ABNORMAL HIGH (ref 39.0–52.0)
Hemoglobin: 18.5 g/dL — ABNORMAL HIGH (ref 13.0–17.0)
MCH: 34.3 pg — AB (ref 26.0–34.0)
MCHC: 34.8 g/dL (ref 30.0–36.0)
MCV: 98.5 fL (ref 78.0–100.0)
PLATELETS: 248 10*3/uL (ref 150–400)
RBC: 5.4 MIL/uL (ref 4.22–5.81)
RDW: 14.5 % (ref 11.5–15.5)
WBC: 12.3 10*3/uL — ABNORMAL HIGH (ref 4.0–10.5)

## 2018-07-05 LAB — ETHANOL: Alcohol, Ethyl (B): 359 mg/dL (ref <10)

## 2018-07-05 NOTE — ED Notes (Signed)
When asked, pt reports that he was using beer as a pillow ecasue he needed a rest  When asked how far he walked, he replies, " 12 steps"

## 2018-07-05 NOTE — ED Notes (Signed)
Continues asleep on L side with even, unlabored respirations

## 2018-07-05 NOTE — ED Notes (Signed)
Pt arrived due to being found on side of the road by police officer using a case of beer as a pillow.  Unsure how long pt had been sleeping on the side of the road.

## 2018-07-05 NOTE — ED Notes (Signed)
Pt continues asleep 

## 2018-07-05 NOTE — ED Notes (Signed)
Resting quietly  Awaiting reeval and referral/dispo

## 2018-07-05 NOTE — ED Notes (Addendum)
Pt reports.  "I was laying on the side of the road wanting to fucking kill something I was in [place] (followed at [facility] last there 15 years ago) and do you know what it is like to kill someone to save your life?" "I need some help" When asked if pt called numbers given last admission to ED, he replies that he has not and does not want to go home

## 2018-07-05 NOTE — ED Triage Notes (Signed)
Here 2 weeks ago for same Did not follow up- nor get meds filled  Pt went home, did not fill meds  Has been drunk since  Drinkingf 1/2 Gallon daily

## 2018-07-05 NOTE — ED Notes (Signed)
Date and time results received: 07/05/18 5:27 PM  (use smartphrase ".now" to insert current time)  Test: Alcohol Critical Value: 359  Name of Provider Notified: Steinl  Orders Received? Or Actions Taken?: Orders Received - See Orders for details

## 2018-07-05 NOTE — ED Notes (Signed)
Meal provided 

## 2018-07-05 NOTE — ED Notes (Signed)
Pt calls out frequently for N to dial phone, apologize and then appempts to flag passer bys of room to come in and speak with  him

## 2018-07-05 NOTE — ED Notes (Signed)
Pt reports first drink age 62  longest time sober "never"   Detox"  "about 20 times"  Last? [facility]

## 2018-07-05 NOTE — Discharge Instructions (Addendum)
It was our pleasure to provide your ER care today - we hope that you feel better.  Do not drink any alcohol - follow up with AA, and use resource guide provided for additional community resources for alcohol abuse/rehab, as well as for behavioral health.   Also follow up with primary care doctor in the coming week.  Return to ER if worse, new symptoms, fevers, trouble breathing, other concern.

## 2018-07-05 NOTE — ED Notes (Signed)
Assisted pt with phone.  Called his wife crying telling her he loved her and to please wish the kids a happy birthday.  Unsure if this is true or not, but pt visibly upset about it.

## 2018-07-05 NOTE — ED Provider Notes (Signed)
[facility] EMERGENCY DEPARTMENT Provider Note   CSN: [id number] Arrival date & time: 07/05/18  1534     History   Chief Complaint Chief Complaint  Patient presents with  . Medical Clearance    HPI William Franklin is a 62 y.o. male.  Patient presents w etoh intoxication, hx etoh abuse. Pt very poor historian, ?due to intoxication - level 5 caveat. States last drank '10 minutes' before coming to ED, cannot quantify amount. Denies other substance abuse. Denies trauma/fall/injury. Denies depression, SI, or thoughts of harm to self or others.   The history is provided by the patient. The history is limited by the condition of the patient.    Past Medical History:  Diagnosis Date  . Alcohol abuse, daily use   . Atrial fibrillation (HCC)   . Chronic back pain   . COPD (chronic obstructive pulmonary disease) (HCC)   . Hypercholesteremia   . Hypertension   . Rib fracture 03/24/2013  . Sciatica     Patient Active Problem List   Diagnosis Date Noted  . Atrial fibrillation with RVR (HCC) 08/01/2017  . Alcohol intoxication (HCC) 08/01/2017  . Leukocytosis 08/01/2017  . Suicidal ideation 08/01/2017    Past Surgical History:  Procedure Laterality Date  . HERNIA REPAIR          Home Medications    Prior to Admission medications   Medication Sig Start Date End Date Taking? Authorizing Provider  albuterol (PROAIR HFA) 108 (90 Base) MCG/ACT inhaler Inhale 1-2 puffs into the lungs every 6 (six) hours as needed for wheezing or shortness of breath.    [provider]  aspirin EC 325 MG tablet Take 325 mg by mouth daily.    [provider]  atorvastatin (LIPITOR) 80 MG tablet Take 40 mg by mouth at bedtime.    [provider]  buPROPion (ZYBAN) 150 MG 12 hr tablet Take 150 mg by mouth 2 (two) times daily. For smoking    [provider]  cyclobenzaprine (FLEXERIL) 10 MG tablet Take 10 mg by mouth 3 (three) times daily as needed for muscle spasms.     [provider]  diltiazem (CARDIZEM CD) 360 MG 24 hr capsule Take 1 capsule (360 mg total) by mouth daily. 08/02/17   Philip Aspen, Limmie Patricia, MD  folic acid (FOLVITE) 1 MG tablet Take 1 tablet (1 mg total) by mouth daily. 08/03/17   Philip Aspen, Limmie Patricia, MD  lisinopril (PRINIVIL,ZESTRIL) 40 MG tablet 40 mg. Take 40 mg by mouth daily.    [provider]  LORazepam (ATIVAN) 1 MG tablet Take 1 tablet (1 mg total) by mouth 3 (three) times daily as needed for anxiety. 06/29/18   Donnetta Hutching, MD  meloxicam (MOBIC) 15 MG tablet Take 1 tablet (15 mg total) by mouth daily. Please take this medication with food 10/05/17   Ivery Quale, PA-C  metoprolol tartrate (LOPRESSOR) 50 MG tablet Take 50 mg by mouth 2 (two) times daily.    [provider]  mirtazapine (REMERON) 15 MG tablet Take 15 mg by mouth at bedtime.    [provider]  Multiple Vitamin (MULTIVITAMIN WITH MINERALS) TABS Take 1 tablet by mouth daily.    [provider]  ondansetron (ZOFRAN) 4 MG tablet Take 1 tablet (4 mg total) by mouth every 6 (six) hours. 06/29/18   Donnetta Hutching, MD  risperiDONE (RISPERDAL) 1 MG tablet Take 1 mg by mouth at bedtime.    [provider]  tamsulosin [facility])  0.4 MG CAPS capsule Take 0.4 mg by mouth daily.    [provider]  thiamine 100 MG tablet Take 1 tablet (100 mg total) by mouth daily. 08/03/17   Philip Aspen, Limmie Patricia, MD  traZODone (DESYREL) 150 MG tablet Take 300 mg by mouth at bedtime.    [provider]    Family History No family history on file.  Social History Social History   Tobacco Use  . Smoking status: Current Every Day Smoker    Packs/day: 1.00    Years: 30.00    Pack years: 30.00    Types: Cigarettes  . Smokeless tobacco: Never Used  Substance Use Topics  . Alcohol use: Yes    Comment: ''As much as I can every day'' -- primary beer  . Drug use: Yes    Frequency: 7.0 times per week    Types:  Marijuana, Oxycodone    Comment: Pt endorsed street Xanax     Allergies   Patient has no known allergies.   Review of Systems Review of Systems  Constitutional: Negative for fever.  HENT: Negative for sore throat.   Eyes: Negative for visual disturbance.  Respiratory: Negative for shortness of breath.   Cardiovascular: Negative for chest pain.  Gastrointestinal: Negative for abdominal pain.  Genitourinary: Negative for flank pain.  Musculoskeletal: Negative for back pain and neck pain.  Skin: Negative for rash.  Neurological: Negative for headaches.  Hematological: Does not bruise/bleed easily.  Psychiatric/Behavioral: Negative for suicidal ideas.     Physical Exam Updated Vital Signs BP (!) 128/95   Pulse 80   Temp 97.9 F (36.6 C) (Oral)   Resp 16   Ht 1.88 m (6\' 2" )   Wt 99.8 kg   SpO2 96%   BMI 28.25 kg/m   Physical Exam  Constitutional: He appears well-developed and well-nourished.  HENT:  Head: Atraumatic.  Mouth/Throat: Oropharynx is clear and moist.  Eyes: Pupils are equal, round, and reactive to light. Conjunctivae are normal. No scleral icterus.  Neck: Normal range of motion. Neck supple. No tracheal deviation present.  Cardiovascular: Normal rate, regular rhythm, normal heart sounds and intact distal pulses. Exam reveals no gallop and no friction rub.  No murmur heard. Pulmonary/Chest: Effort normal and breath sounds normal. No accessory muscle usage. No respiratory distress. He exhibits no tenderness.  Abdominal: Soft. Bowel sounds are normal. He exhibits no distension. There is no tenderness.  Genitourinary:  Genitourinary Comments: No cva tenderness.   Musculoskeletal: He exhibits no edema.  CTLS spine, non tender, aligned, no step off. No focal bony tenderness on bil extremity exam, distal pulses palp bil.   Neurological: He is alert.  Appears intoxicated. Moves bil extremities purposefully w good strength.   Skin: Skin is warm and dry. No rash  noted.  Psychiatric:  Intoxicated. Denies SI.   Nursing note and vitals reviewed.    ED Treatments / Results  Labs (all labs ordered are listed, but only abnormal results are displayed) Results for orders placed or performed during the hospital encounter of 07/05/18  Comprehensive metabolic panel  Result Value Ref Range   Sodium 136 135 - 145 mmol/L   Potassium 3.9 3.5 - 5.1 mmol/L   Chloride 99 98 - 111 mmol/L   CO2 24 22 - 32 mmol/L   Glucose, Bld 107 (H) 70 - 99 mg/dL   BUN 10 8 - 23 mg/dL   Creatinine, Ser 4.25 0.61 - 1.24 mg/dL   Calcium 8.2 (L) 8.9 - 10.3  mg/dL   Total Protein 7.5 6.5 - 8.1 g/dL   Albumin 4.1 3.5 - 5.0 g/dL   AST 23 15 - 41 U/L   ALT 26 0 - 44 U/L   Alkaline Phosphatase 92 38 - 126 U/L   Total Bilirubin 0.6 0.3 - 1.2 mg/dL   GFR calc non Af Amer >60 >60 mL/min   GFR calc Af Amer >60 >60 mL/min   Anion gap 13 5 - 15  Ethanol  Result Value Ref Range   Alcohol, Ethyl (B) 359 (HH) <10 mg/dL  cbc  Result Value Ref Range   WBC 12.3 (H) 4.0 - 10.5 K/uL   RBC 5.40 4.22 - 5.81 MIL/uL   Hemoglobin 18.5 (H) 13.0 - 17.0 g/dL   HCT 16.1 (H) 09.6 - 04.5 %   MCV 98.5 78.0 - 100.0 fL   MCH 34.3 (H) 26.0 - 34.0 pg   MCHC 34.8 30.0 - 36.0 g/dL   RDW 40.9 81.1 - 91.4 %   Platelets 248 150 - 400 K/uL  Rapid urine drug screen (hospital performed)  Result Value Ref Range   Opiates NONE DETECTED NONE DETECTED   Cocaine NONE DETECTED NONE DETECTED   Benzodiazepines NONE DETECTED NONE DETECTED   Amphetamines NONE DETECTED NONE DETECTED   Tetrahydrocannabinol NONE DETECTED NONE DETECTED   Barbiturates NONE DETECTED NONE DETECTED   EKG None  Radiology No results found.  Procedures Procedures (including critical care time)  Medications Ordered in ED Medications - No data to display   Initial Impression / Assessment and Plan / ED Course  I have reviewed the triage vital signs and the nursing notes.  Pertinent labs & imaging results that were available  during my care of the patient were reviewed by me and considered in my medical decision making (see chart for details).  Labs sent.   Reviewed nursing notes and prior charts for additional history.   Recheck, resting comfortably. Easily aroused. Pt denies any new or specific c/o.   Labs reviewed - etoh high.  Will observe in ED until clinically more sober.   Po fluids. Food.   Reviewed nursing notes and prior charts for additional history.   Pt with history etoh related mood disorder - is very intoxicated. Signed out to oncoming provider to reassess in AM when more sober, and disposition appropriately then.     Final Clinical Impressions(s) / ED Diagnoses   Final diagnoses:  None    ED Discharge Orders    None       Cathren Laine, MD 07/05/18 2251

## 2018-07-05 NOTE — ED Notes (Signed)
Chaplain in to speak with pt 

## 2018-07-05 NOTE — ED Notes (Signed)
Pt has eaten meal  Currently sleeping

## 2018-07-06 MED ORDER — LORAZEPAM 1 MG PO TABS: 1.0000 mg | ORAL_TABLET | Freq: Three times a day (TID) | ORAL | 0 refills | Status: AC | PRN

## 2018-07-06 MED ORDER — LORAZEPAM 1 MG PO TABS
1.0000 mg | ORAL_TABLET | Freq: Once | ORAL | Status: AC
  Administered 2018-07-06: 1 mg via ORAL
  Filled 2018-07-06: qty 1

## 2018-07-06 NOTE — ED Provider Notes (Signed)
He is Now more awake and alert.  He is showing some signs of mild alcohol withdrawal.  His ciwa score is 9 He has a mild tremor, but no mental status changes.  He has been given Ativan. Patient has a long history of alcohol abuse, he reports he drinks daily.  He was even unsure how he got to the hospital.  He reports he has no way home.  He reports he has been through multiple rehabs before, and I instructed him that we will give him outpatient resources.  Will need to secure a ride home, but I feel he would be appropriate for Ativan taper prior to discharge   Zadie Rhine, MD 07/06/18 [phone]

## 2018-07-06 NOTE — ED Notes (Signed)
Pt has been discharged. Stated he is about to leave.

## 2018-10-03 ENCOUNTER — Emergency Department (HOSPITAL_COMMUNITY): Payer: Non-veteran care

## 2018-10-03 ENCOUNTER — Other Ambulatory Visit: Payer: Self-pay

## 2018-10-03 ENCOUNTER — Emergency Department (HOSPITAL_COMMUNITY)
Admission: EM | Admit: 2018-10-03 | Discharge: 2018-10-03 | Disposition: A | Discharge status: Home / Self Care | Payer: Non-veteran care | Attending: Emergency Medicine | Admitting: Emergency Medicine

## 2018-10-03 ENCOUNTER — Encounter (HOSPITAL_COMMUNITY): Payer: Self-pay

## 2018-10-03 DIAGNOSIS — I4891 Unspecified atrial fibrillation: Secondary | ICD-10-CM | POA: Insufficient documentation

## 2018-10-03 DIAGNOSIS — Y929 Unspecified place or not applicable: Secondary | ICD-10-CM | POA: Insufficient documentation

## 2018-10-03 DIAGNOSIS — W208XXA Other cause of strike by thrown, projected or falling object, initial encounter: Secondary | ICD-10-CM | POA: Insufficient documentation

## 2018-10-03 DIAGNOSIS — S92345A Nondisplaced fracture of fourth metatarsal bone, left foot, initial encounter for closed fracture: Secondary | ICD-10-CM | POA: Diagnosis not present

## 2018-10-03 DIAGNOSIS — Y9389 Activity, other specified: Secondary | ICD-10-CM | POA: Insufficient documentation

## 2018-10-03 DIAGNOSIS — F17210 Nicotine dependence, cigarettes, uncomplicated: Secondary | ICD-10-CM | POA: Diagnosis not present

## 2018-10-03 DIAGNOSIS — S99922A Unspecified injury of left foot, initial encounter: Secondary | ICD-10-CM | POA: Diagnosis present

## 2018-10-03 DIAGNOSIS — I10 Essential (primary) hypertension: Secondary | ICD-10-CM | POA: Diagnosis not present

## 2018-10-03 DIAGNOSIS — Z79899 Other long term (current) drug therapy: Secondary | ICD-10-CM | POA: Diagnosis not present

## 2018-10-03 DIAGNOSIS — Y999 Unspecified external cause status: Secondary | ICD-10-CM | POA: Diagnosis not present

## 2018-10-03 DIAGNOSIS — Z7982 Long term (current) use of aspirin: Secondary | ICD-10-CM | POA: Diagnosis not present

## 2018-10-03 DIAGNOSIS — J449 Chronic obstructive pulmonary disease, unspecified: Secondary | ICD-10-CM | POA: Insufficient documentation

## 2018-10-03 DIAGNOSIS — T148XXA Other injury of unspecified body region, initial encounter: Secondary | ICD-10-CM

## 2018-10-03 MED ORDER — LIDOCAINE 5 % EX PTCH
1.0000 | MEDICATED_PATCH | CUTANEOUS | Status: DC
  Administered 2018-10-03: 1 via TRANSDERMAL
  Filled 2018-10-03 (×4): qty 1

## 2018-10-03 MED ORDER — HYDROCODONE-ACETAMINOPHEN 5-325 MG PO TABS: 1.0000 | ORAL_TABLET | ORAL | 0 refills | Status: AC | PRN

## 2018-10-03 MED ORDER — LIDOCAINE 5 % EX PTCH: 1.0000 | MEDICATED_PATCH | CUTANEOUS | 0 refills | Status: AC

## 2018-10-03 NOTE — Discharge Instructions (Addendum)
Continue to ice and elevate as much as possible to help with pain and swelling in your foot.  Wear the cam walker to protect your foot at all times.  Use the hydrocodone tablets as prescribed if needed for pain relief.  This medication will only be prescribed once from here given your acute injury, any further prescriptions will need to come from your primary doctor or your orthopedist.  Do not drive within 4 hours of taking this medication as it will make you drowsy.  You may find that the Lidoderm patch offers significant relief of pain symptoms as well and you have been prescribed a prescription for this.

## 2018-10-03 NOTE — ED Triage Notes (Signed)
Pt dropped wood on his left foot yesterday. Went to the [facility]. and got an xray yesterday. Xray showed a fracture. Pt is ambulatory. NAD.

## 2018-10-03 NOTE — ED Provider Notes (Signed)
[facility] EMERGENCY DEPARTMENT Provider Note   CSN: [id number] Arrival date & time: 10/03/18  1226     History   Chief Complaint Chief Complaint  Patient presents with  . Foot Injury    HPI William Franklin is a 63 y.o. male with a past medical history including atrial fibrillation, recent right lower extremity DVT being treated with Eliquis, COPD, hypertension and sciatica, also a history of EtOH abuse, reporting he has been sober for the past 2 months dropped a piece of wood on his left foot several days ago which became painful and swollen along with bruising that developed in his left second toe over the past several days.  He was therefore seen by his provider at the [facility] and x-rays obtained were read today and was diagnosed with a fourth distal metatarsal fracture.  He received a phone call stating he needs to go to the emergency department for definitive treatment of this injury.  He has been icing and elevating the foot without improvement in pain which he describes as throbbing and constant.  He denies numbness distal to the injury site.  He has found no alleviators for his pain.  The history is provided by the patient.    Past Medical History:  Diagnosis Date  . Alcohol abuse, daily use   . Atrial fibrillation (HCC)   . Chronic back pain   . COPD (chronic obstructive pulmonary disease) (HCC)   . Hypercholesteremia   . Hypertension   . Rib fracture 03/24/2013  . Sciatica     Patient Active Problem List   Diagnosis Date Noted  . Atrial fibrillation with RVR (HCC) 08/01/2017  . Alcohol intoxication (HCC) 08/01/2017  . Leukocytosis 08/01/2017  . Suicidal ideation 08/01/2017    Past Surgical History:  Procedure Laterality Date  . HERNIA REPAIR          Home Medications    Prior to Admission medications   Medication Sig Start Date End Date Taking? Authorizing Provider  albuterol (PROAIR HFA) 108 (90 Base) MCG/ACT inhaler Inhale 1-2 puffs into the lungs  every 6 (six) hours as needed for wheezing or shortness of breath.    [provider]  aspirin EC 325 MG tablet Take 325 mg by mouth daily.    [provider]  atorvastatin (LIPITOR) 80 MG tablet Take 40 mg by mouth at bedtime.    [provider]  buPROPion (ZYBAN) 150 MG 12 hr tablet Take 150 mg by mouth 2 (two) times daily. For smoking    [provider]  cyclobenzaprine (FLEXERIL) 10 MG tablet Take 10 mg by mouth 3 (three) times daily as needed for muscle spasms.    [provider]  diltiazem (CARDIZEM CD) 360 MG 24 hr capsule Take 1 capsule (360 mg total) by mouth daily. 08/02/17   Philip Aspen, Limmie Patricia, MD  folic acid (FOLVITE) 1 MG tablet Take 1 tablet (1 mg total) by mouth daily. 08/03/17   Philip Aspen, Limmie Patricia, MD  HYDROcodone-acetaminophen (NORCO/VICODIN) 5-325 MG tablet Take 1 tablet by mouth every 4 (four) hours as needed. 10/03/18   Burgess Amor, PA-C  lidocaine (LIDODERM) 5 % Place 1 patch onto the skin daily. Remove & Discard patch within 12 hours or as directed by MD 10/03/18   Burgess Amor, PA-C  lisinopril (PRINIVIL,ZESTRIL) 40 MG tablet 40 mg. Take 40 mg by mouth daily.    [provider]  LORazepam (ATIVAN) 1 MG tablet Take 1 tablet (1 mg total) by  mouth every 8 (eight) hours as needed for anxiety. 07/06/18   Zadie Rhine, MD  meloxicam (MOBIC) 15 MG tablet Take 1 tablet (15 mg total) by mouth daily. Please take this medication with food 10/05/17   Ivery Quale, PA-C  metoprolol tartrate (LOPRESSOR) 50 MG tablet Take 50 mg by mouth 2 (two) times daily.    [provider]  mirtazapine (REMERON) 15 MG tablet Take 15 mg by mouth at bedtime.    [provider]  Multiple Vitamin (MULTIVITAMIN WITH MINERALS) TABS Take 1 tablet by mouth daily.    [provider]  ondansetron (ZOFRAN) 4 MG tablet Take 1 tablet (4 mg total) by mouth every 6 (six) hours. 06/29/18   Donnetta Hutching, MD  risperiDONE (RISPERDAL) 1  MG tablet Take 1 mg by mouth at bedtime.    [provider]  tamsulosin (FLOMAX) 0.4 MG CAPS capsule Take 0.4 mg by mouth daily.    [provider]  thiamine 100 MG tablet Take 1 tablet (100 mg total) by mouth daily. 08/03/17   Philip Aspen, Limmie Patricia, MD  traZODone (DESYREL) 150 MG tablet Take 300 mg by mouth at bedtime.    [provider]    Family History No family history on file.  Social History Social History   Tobacco Use  . Smoking status: Current Every Day Smoker    Packs/day: 1.00    Years: 30.00    Pack years: 30.00    Types: Cigarettes  . Smokeless tobacco: Never Used  Substance Use Topics  . Alcohol use: Yes    Comment: ''As much as I can every day'' -- primary beer  . Drug use: Yes    Frequency: 7.0 times per week    Types: Marijuana, Oxycodone    Comment: Pt endorsed street Xanax     Allergies   Patient has no known allergies.   Review of Systems Review of Systems  Constitutional: Negative for fever.  Musculoskeletal: Positive for arthralgias and joint swelling. Negative for myalgias.  Neurological: Negative for weakness and numbness.     Physical Exam Updated Vital Signs BP 99/80 (BP Location: Right Arm)   Pulse 86   Temp 98 F (36.7 C) (Oral)   Resp 12   Ht 6\' 2"  (1.88 m)   Wt 106.6 kg   SpO2 95%   BMI 30.17 kg/m   Physical Exam Constitutional:      Appearance: He is well-developed.  HENT:     Head: Atraumatic.  Neck:     Musculoskeletal: Normal range of motion.  Cardiovascular:     Comments: Pulses equal bilaterally Musculoskeletal:        General: Swelling, tenderness and signs of injury present.     Comments: Tenderness to palpation of the left dorsal foot which localizes to the distal fourth metatarsal.  There is mild edema, no palpable or visible deformity.  He is got full DP pulses bilaterally.  Distal sensation is intact in his toes.  Less than 2-second cap refill.  There is moderate bruising noted  to the proximal left second dorsal toe.  Skin:    General: Skin is warm and dry.  Neurological:     Mental Status: He is alert.     Sensory: No sensory deficit.     Deep Tendon Reflexes: Reflexes normal.      ED Treatments / Results  Labs (all labs ordered are listed, but only abnormal results are displayed) Labs Reviewed - No data to display  EKG  None  Radiology Dg Foot Complete Left  Result Date: 10/03/2018 CLINICAL DATA:  Acute left foot pain and swelling after injury 5 days ago. EXAM: LEFT FOOT - COMPLETE 3+ VIEW COMPARISON:  None. FINDINGS: Mildly displaced fracture is seen involving the distal portion of the fourth metatarsal. Joint spaces are intact. No other significant bony abnormality is noted. IMPRESSION: Mildly displaced distal fourth metatarsal fracture. Electronically Signed   By: Lupita RaiderJames  Green Jr, M.D.   On: 10/03/2018 13:59    Procedures Procedures (including critical care time)  Medications Ordered in ED Medications  lidocaine (LIDODERM) 5 % 1 patch (has no administration in time range)     Initial Impression / Assessment and Plan / ED Course  I have reviewed the triage vital signs and the nursing notes.  Pertinent labs & imaging results that were available during my care of the patient were reviewed by me and considered in my medical decision making (see chart for details).     Imaging reviewed and discussed with patient.  He was placed in a cam walker.  Discussed continued ice and elevation as needed.  He was prescribed a limited quantity of hydrocodone tablets.  [employer] [place] database was reviewed.  Given acute fracture, given a small quantity of pain medication is reasonable.  He was also given a Lidoderm patch for topical pain relief and prescription for the same.  [facility] and he was encouraged to call for a follow-up appointment within the next week.  Final Clinical Impressions(s) / ED Diagnoses   Final diagnoses:    Closed nondisplaced fracture of fourth metatarsal bone of left foot, initial encounter    ED Discharge Orders         Ordered    HYDROcodone-acetaminophen (NORCO/VICODIN) 5-325 MG tablet  Every 4 hours PRN     10/03/18 1444    lidocaine (LIDODERM) 5 %  Every 24 hours     10/03/18 1444           Burgess Amordol, Dorathy Stallone, PA-C 10/03/18 1447    Benjiman CorePickering, Nathan, MD 10/03/18 1456

## 2018-10-03 NOTE — ED Notes (Signed)
Pt reports he dropped wood on foot approx 5 days ago. Increase pain and swelling in left foot. Pt went to [place] in [place] and foot was xrayed and was told he needed to come to ED

## 2019-01-30 DEATH — deceased
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
